# Patient Record
Sex: Male | Born: 1935 | Race: White | Hispanic: No | Smoking: Never smoker
Health system: Southern US, Community
[De-identification: ages and names within clinical notes are randomized; demographics above are authoritative.]

## PROBLEM LIST (undated history)

## (undated) DIAGNOSIS — F0390 Unspecified dementia without behavioral disturbance: Secondary | ICD-10-CM

## (undated) DIAGNOSIS — E119 Type 2 diabetes mellitus without complications: Secondary | ICD-10-CM

## (undated) DIAGNOSIS — C801 Malignant (primary) neoplasm, unspecified: Secondary | ICD-10-CM

## (undated) DIAGNOSIS — I240 Acute coronary thrombosis not resulting in myocardial infarction: Secondary | ICD-10-CM

## (undated) HISTORY — DX: Acute coronary thrombosis not resulting in myocardial infarction: I24.0

## (undated) HISTORY — PX: OTHER SURGICAL HISTORY: SHX169

## (undated) HISTORY — PX: CAROTID STENT: SHX1301

## (undated) HISTORY — DX: Unspecified dementia, unspecified severity, without behavioral disturbance, psychotic disturbance, mood disturbance, and anxiety: F03.90

## (undated) HISTORY — PX: KNEE SURGERY: SHX244

## (undated) HISTORY — PX: COLECTOMY: SHX59

## (undated) HISTORY — DX: Type 2 diabetes mellitus without complications: E11.9

## (undated) HISTORY — PX: HERNIA REPAIR: SHX51

---

## 2009-09-15 ENCOUNTER — Ambulatory Visit: Payer: Self-pay | Admitting: Ophthalmology

## 2009-10-20 ENCOUNTER — Ambulatory Visit: Payer: Self-pay | Admitting: Ophthalmology

## 2010-10-05 ENCOUNTER — Ambulatory Visit: Payer: Self-pay | Admitting: Orthopedic Surgery

## 2010-11-12 ENCOUNTER — Ambulatory Visit: Payer: Self-pay | Admitting: Family Medicine

## 2010-11-20 ENCOUNTER — Ambulatory Visit: Payer: Self-pay | Admitting: Internal Medicine

## 2011-10-23 ENCOUNTER — Ambulatory Visit: Payer: Self-pay | Admitting: Orthopedic Surgery

## 2011-11-01 ENCOUNTER — Inpatient Hospital Stay: Payer: Self-pay | Admitting: Orthopedic Surgery

## 2013-11-09 ENCOUNTER — Emergency Department: Payer: Self-pay | Admitting: Emergency Medicine

## 2013-11-14 ENCOUNTER — Encounter: Payer: Self-pay | Admitting: Podiatry

## 2013-11-14 ENCOUNTER — Ambulatory Visit: Payer: Medicare Other | Admitting: Podiatry

## 2013-11-14 VITALS — BP 115/70 | HR 60 | Resp 16 | Ht 71.0 in | Wt 191.0 lb

## 2013-11-14 DIAGNOSIS — L600 Ingrowing nail: Secondary | ICD-10-CM

## 2013-11-14 NOTE — Progress Notes (Signed)
Subjective:     Patient ID: Don Rhodes, male   DOB: [birth year], 79 y.o.   MRN: [id number]  HPI patient presents with a painful ingrown toenail left over right big toe. States it's been there for a long time he usually tries to pull it out himself   Review of Systems  All other systems reviewed and are negative.       Objective:   Physical Exam  Nursing note and vitals reviewed. Constitutional: He is oriented to person, place, and time.  Cardiovascular: Intact distal pulses.   Neurological: He is oriented to person, place, and time.  Skin: Skin is warm.   neurovascular status intact with muscle strength adequate and no other health history changes noted. Incurvated nail bed left hallux lateral border and mildly on the right with pain on the left hallux    Assessment:     Ingrown toenail left over right hallux lateral border    Plan:     H&P performed and conditions discussed. I recommended remove the of the and explained risk of this procedure. Infiltrated 60 mm Xylocaine Marcaine mixture and remove the lateral border exposing matrix and applying chemical phenol followed by alcohol 3 applications 30 seconds and sterile dressing. Instructed on soaks and to return for the right one if it bothers him

## 2013-11-14 NOTE — Patient Instructions (Signed)

## 2013-11-14 NOTE — Progress Notes (Signed)
   Subjective:    Patient ID: Don Rhodes, male    DOB: [birth year], 79 y.o.   MRN: [id number]  HPI Comments: N bleeding cut the toenail  L  Left great toenail lateral corner  D Thursday  night  O C better  A [facility] room for a tetanus shot      Review of Systems     Objective:   Physical Exam        Assessment & Plan:

## 2013-12-28 ENCOUNTER — Emergency Department: Payer: Self-pay | Admitting: Emergency Medicine

## 2013-12-28 LAB — BASIC METABOLIC PANEL
ANION GAP: 6 — AB (ref 7–16)
BUN: 11 mg/dL (ref 7–18)
CALCIUM: 9.6 mg/dL (ref 8.5–10.1)
CHLORIDE: 105 mmol/L (ref 98–107)
Co2: 26 mmol/L (ref 21–32)
Creatinine: 1.07 mg/dL (ref 0.60–1.30)
GLUCOSE: 149 mg/dL — AB (ref 65–99)
Osmolality: 276 (ref 275–301)
Potassium: 4.6 mmol/L (ref 3.5–5.1)
Sodium: 137 mmol/L (ref 136–145)

## 2013-12-28 LAB — CBC
HCT: 49 % (ref 40.0–52.0)
HGB: 16.4 g/dL (ref 13.0–18.0)
MCH: 30.8 pg (ref 26.0–34.0)
MCHC: 33.5 g/dL (ref 32.0–36.0)
MCV: 92 fL (ref 80–100)
Platelet: 172 10*3/uL (ref 150–440)
RBC: 5.33 10*6/uL (ref 4.40–5.90)
RDW: 13.6 % (ref 11.5–14.5)
WBC: 9.7 10*3/uL (ref 3.8–10.6)

## 2013-12-28 LAB — TROPONIN I
Troponin-I: <0.02 ng/mL
Troponin-I: <0.02 ng/mL

## 2013-12-28 LAB — PRO B NATRIURETIC PEPTIDE: B-Type Natriuretic Peptide: 102 pg/mL (ref 0–450)

## 2013-12-29 ENCOUNTER — Ambulatory Visit: Payer: Self-pay | Admitting: Gastroenterology

## 2014-01-05 ENCOUNTER — Ambulatory Visit: Payer: Self-pay | Admitting: Gastroenterology

## 2014-01-08 LAB — PATHOLOGY REPORT

## 2014-01-16 ENCOUNTER — Ambulatory Visit: Payer: Self-pay | Admitting: Gastroenterology

## 2014-11-18 ENCOUNTER — Ambulatory Visit: Payer: Self-pay | Admitting: Cardiology

## 2014-11-18 LAB — CK TOTAL AND CKMB (NOT AT ARMC)
CK, TOTAL: 88 U/L (ref 39–308)
CK-MB: 2.9 ng/mL (ref 0.5–3.6)

## 2014-11-19 LAB — BASIC METABOLIC PANEL
ANION GAP: 3 — AB (ref 7–16)
BUN: 15 mg/dL (ref 7–18)
CALCIUM: 8.2 mg/dL — AB (ref 8.5–10.1)
Chloride: 106 mmol/L (ref 98–107)
Co2: 31 mmol/L (ref 21–32)
Creatinine: 1.31 mg/dL — ABNORMAL HIGH (ref 0.60–1.30)
EGFR (Non-African Amer.): 56 — ABNORMAL LOW
GLUCOSE: 151 mg/dL — AB (ref 65–99)
Osmolality: 283 (ref 275–301)
POTASSIUM: 4.4 mmol/L (ref 3.5–5.1)
Sodium: 140 mmol/L (ref 136–145)

## 2014-11-26 ENCOUNTER — Ambulatory Visit: Payer: Self-pay | Admitting: Cardiology

## 2014-12-07 ENCOUNTER — Ambulatory Visit: Payer: Self-pay | Admitting: Physician Assistant

## 2015-01-24 ENCOUNTER — Ambulatory Visit: Payer: Self-pay | Admitting: Registered Nurse

## 2015-03-14 NOTE — Op Note (Signed)
PATIENT NAME:  Don Rhodes, Don Rhodes MR#:  [id number] DATE OF BIRTH:  [birth year]  DATE OF PROCEDURE:  11/06/2011  PREOPERATIVE DIAGNOSIS: Right knee painful unicompartmental knee replacement with possible infection.   POSTOPERATIVE DIAGNOSIS: Right knee painful unicompartmental knee replacement.   OPERATION: Right knee removal of antibiotic spacer and completion of right total knee arthroplasty.   SURGEON: Timoteo Gaul, MD   ASSISTANT: Christophe Louis, MD   ANESTHESIA: General with femoral nerve block and 0.25% Marcaine with epi local injection.   INDICATIONS FOR PROCEDURE: The patient is a 81 year old male who had his unicompartmental arthroplasty components removed on 11/01/2011. During removal of the components, the patient had a brownish discoloration to the synovium and the synovial fluid. Culture swabs were sent stat from the operating room and came back showing rare intracellular  coccobacilli. Therefore, an antibiotic spacer was placed after removal of the unicompartmental knee components. The patient was then closed and started on ertapenem for gram-negative coverage. He has had negative cultures for four and now five days. Infectious Disease specialist, Dr. Clayborn Bigness was consulted and has been following. He felt given the negative cultures it was appropriate to implant the patient's total knee components at this time. I reviewed again the risks and benefits of surgery with the patient. The risks of completing his total knee arthroplasty continue to include infection, nerve or blood vessel injury, bleeding requiring blood transfusion, persistent pain or instability, fracture, dislocation, knee stiffness, as well as the need for further surgery. Medical complications include DVT and pulmonary embolism, myocardial infarction, stroke, pneumonia, respiratory failure, and death. The patient signed the consent form. This was done on 11/05/2011 in preparation for surgery today.   PROCEDURE NOTE:  The patient was brought to the operating room where he underwent a femoral nerve block by the anesthesia service and then was given general endotracheal intubation. The patient was prepped and draped in a sterile fashion. A time-out was performed to verify the patient's name, date of birth, medical record number, correct site of surgery, and correct procedure to be performed. It was also used to verify the patient had received antibiotics. He had already received his dose of Ertapenem for this 24-hour period. He was given 2 grams of Kefzol in addition. It was also used to confirm that all appropriate instruments and radiographic studies were available in the room. Once all in attendance were in agreement, the case began.   The original incision was reopened. All suture material was removed. The antibiotic spacer was removed with a Kocher and then the knee joint was copiously irrigated with pulse lavage impregnated with GU antibiotic.   The attention was then turned to completing tibial preparation. The patient had bone loss on the medial side from his previous uniknee arthroplasty. A size 5 tibial trial tray was placed over the tibia with a cutting block attachment medially. This was pinned into place. The patient's bone defect was measured to be 10 mm involving greater than half of the medial tibial plateau. Therefore, decision was made to make a 10 mm cut which would require a 10 mm metallic augment. This 10 mm cut was then made after drop-down alignment guide was used to ensure perpendicular cut to the tibial mechanical access. Once the cut was made, a size 5 tibial trial was placed with a 10 mm augment and found to have excellent fill of the defect and excellent coverage of the proximal tibial surface.   Of note, the tibial Keel had been drilled just prior  to making the 10 mm cut for the augment which provided stability to the tibial tray during proximal tibial augment osteotomy.   The attention was then  turned to completing preparation of the distal femur. A box cutting guide was placed over the distal femur. It was pinned into place. The box for the posterior stabilized component was cut using small and wide blades. The central hole in the femoral canal was copiously irrigated and then plugged with bone graft. The size 4 femoral trial was then put into place and found to have excellent fit. A size 4 tibial tray was then inserted and the knee placed into extension. The patient was found to have more widening laterally. Medial soft tissue release was performed by creating a larger soft tissue sleeve over the proximal medial tibia. This allowed for placement of a size 4 12.5 mm tibial trial which had excellent medial and lateral stability both in full extension and 90 degrees of flexion. The knee was then placed into extension. The attention was turned to patella preparation.   The patella was templated to have the best fit at 41 mm patellar button. The thickness of the patella was found to be 20 mm. 17 mm was removed leaving patella 11 mm in thickness. The 41 mm patellar drill guide was placed over the osteotomized patella and the three pegs were drilled. The patellar trial was then put into place and the patella was returned to its place in the trochlea of the femur. The knee was taken through full range of motion and the patella was stable. All trial instruments were then removed. The knee was copiously irrigated. All bony surfaces were adequately dried.   Gentamicin impregnated methylmethacrylate was used for implantation of the actual components. A size 5 DePuy keeled tibial MDX tray with a 10 mm metallic medial augment was cemented into place along with a DePuy PFC Sigma posterior stabilized femoral component and a 41 mm patella and a 12.5 mm size 4 tibial polyethylene component for rotating platform. All components were cemented into place with a 12.5 mm trial spacer during the curing process of the  methylmethacrylate. The knee was then again taken through a full range of motion and found to be stable. The actual 12.5 mm size 4 tibial polyethylene tray was then placed. The wound again was copiously irrigated. The Autovac was placed with the two limbs of the Autovac exiting superolaterally. The medial arthrotomy was closed with interrupted #1 Ethibond. Again, the wound was copiously irrigated. The dermal layers of skin were closed with interrupted 2-0 Vicryl and the skin approximated with staples. A dry sterile dressing was applied. The tourniquet was let down at 127 minutes. The tourniquet had been inflated at the beginning of the case. It was inflated to 275 mmHg and the leg had been exsanguinated with an Esmarch prior to its inflation. Dry sterile dressing was applied along with a Polar Care and a knee immobilizer. The patient was brought to the PAC-U in stable condition after being extubated. I was scrubbed and present for the entire case and all sharp and instrument counts were correct at the conclusion of the case.   I spoke with the daughter by phone from the PAC-U to let her know the case had gone without complication and that her father was stable in the recovery room.   ____________________________ Timoteo Gaul, MD klk:drc D: 11/06/2011 20:23:01 ET T: 11/07/2011 08:31:35 ET JOB#: [id number]  cc: Timoteo Gaul, MD, <Dictator> Yaslyn Cumby L  Mack Guise MD ELECTRONICALLY SIGNED 11/27/2011 13:36

## 2015-03-14 NOTE — Discharge Summary (Signed)
PATIENT NAME:  Don Rhodes, EARNSHAW MR#:  [id number] DATE OF BIRTH:  [birth year]  DATE OF ADMISSION:  11/01/2011 DATE OF DISCHARGE:  11/10/2011  ADMITTING DIAGNOSES:  1. Right revision arthroplasty from unicompartmental to total knee arthroplasty. 2. Question of right knee infection.  HISTORY: Mr. Betts is a 81 year old male who has had persistent right knee pain for over a year. This has affected his ability to ambulate without severe pain. His Uniknee arthroplasty was performed in [place] in 2007. Given his pain and disability, he was scheduled for conversion of a Uniknee arthroplasty to a total knee arthroplasty here at [facility]. The patient was scheduled for elective surgery, which was scheduled on 11/01/2011.   HOSPITAL COURSE: The patient was admitted to the hospital for surgery on 11/01/2011. The patient during the operative procedure had brownish-appearing fluid discovered during the knee arthrotomy. This was sent to our microbiology department for stat Gram stain. The OR was called with the result of rare gram-negative coccobacilli. Given this finding, the surgical plan was changed. The polyethylene and metal components of the Uniknee arthroplasty were removed and the patient had already had his tibial and femoral osteotomies performed. Instead of implanting new total knee arthroplasty components, the decision was made to place an antibiotic spacer and to admit the patient to the hospital for IV antibiotic treatment. Dr. Lanae Boast, our infectious disease specialist, was consulted and agreed with this plan.   The patient was admitted to the hospital while final culture results were available. Dr. Clayborn Bigness followed the patient throughout his hospitalization as well. On postoperative day #1, the patient was comfortable and had no complaints. The patient was receiving ertapenem for the gram-negative coccobacilli. Postoperative day #2 the patient again was doing well without  pain. His laboratories remained stable including a CBC and BMP. The patient continued throughout his hospitalization without any acute medical or postoperative issues. His incision was clean, dry, and intact and was without erythema or drainage. The patient remained neurovascularly intact. The patient's cultures were negative throughout this hospitalization. Given these negative cultures, Dr. Clayborn Bigness felt it was appropriate to proceed with the remainder of his revision total knee surgery. On 12/17, Mr. Couser was brought back to the Operating Room. He underwent an uncomplicated implantation of a total knee arthroplasty prosthesis. Postoperatively, the patient was brought back to his hospital room. He had some issues with postoperative pain initially. Postoperative x-rays determine that the components were in good position. There is no evidence of fracture-dislocation or other bony abnormality. On postoperative day #1, the patient was out of bed to a chair. He had moderate pain in the right knee, but no other acute events. His postoperative hematocrit remained stable. He had physical and occupational therapy. Consults called and they continued to work with him throughout his hospitalization. On postoperative day #2, the physical therapist stated that the patient was struggling with physical therapy and was experiencing desaturations during physical therapy. He had required supplemental to maintain his saturation levels which is a clinical change. His hematocrit remained stable at 29.4. A CT PA gram was performed to rule out deep venous thrombosis and this was negative for pulmonary embolism, but just showed increased interstitial density in the lungs, possibly indicating a low-grade congestive heart failure or atelectasis. By postoperative day #3, the patient's pain had significantly improved. He was tolerating p.o. diet and CPM in his bed and by postoperative day #4, the patient was doing very well. He had no pain  in the right  knee. He had passed a bowel movement. He was participating well with physical therapy and his laboratories remained stable. Given the patient's clinical improvement, he was prepared for discharge to home. His Foley catheter had been removed by postoperative day #1 and he had undergone 24 hours of postoperative Kefzol after the second stage of his operation. The patient was doing well enough that he will be discharged home with services. With physical therapy, the patient was able to ambulate 300 feet with a rolling walker and maintain his partial weight-bearing status on the right side.   DISCHARGE INSTRUCTIONS:  1. The patient will be discharged home with PT and OT services and home health. 2. He will remain partial weight-bearing on the right lower extremity and elevate the right lower extremity whenever possible.  3. He will continue using TED hose on both legs.  4. He will be discharged on Lovenox 40 mg subcutaneous daily.  5. He will receive physical therapy.  6. He was written for a CPM device to continue 0 to 90 degrees of flexion as his pain allows. He will start at 30 degrees of flexion and advance 5 to 10 degrees every day as his pain allows.  7. The patient will be seen in the office in 7 to 10 days postoperative and will have his staples removed at the time of his office visit.  8. He will have daily dressing changes until his incision is dry.  9. He is to avoid getting the knee incision wet. He will contact the office with any changes in bowel or bladder function, weakness in the lower extremity or any numbness or tingling in the lower extremity or redness and swelling.   DISCHARGE MEDICATIONS: 1. Aspirin 81 mg.  2. Clonazepam 1 mg b.i.d. p.r.n.  3. Glipizide 2.5 mg extended-release 1 tablet daily. 4. Metoprolol 25 mg once daily. 5. Namenda 10 mg 1 tablet b.i.d.  6. Colace 100 mg daily. 7. Pravastatin 80 mg daily. 8. Sertraline 25 mg daily.  9. Tramadol 50 mg every six  hours p.r.n.  10. MiraLAX oral powder once daily p.r.n.  11. Oxycodone 5 milligrams 1 to 2 tabs every 4 to 6 hours p.r.n. for pain. 12. Lovenox 40 mg subcutaneous daily.   DISCHARGE DIAGNOSES: 1. Right knee stage revision arthroplasty from unicompartmental to total knee arthroplasty.  2. No evidence of septic right knee by culture.   ____________________________ Timoteo Gaul, MD klk:ap D: 12/10/2011 13:36:21 ET T: 12/10/2011 14:33:47 ET JOB#: [id number]  cc: Timoteo Gaul, MD, <Dictator> Timoteo Gaul MD ELECTRONICALLY SIGNED 12/12/2011 9:58

## 2015-06-05 ENCOUNTER — Emergency Department
Admission: EM | Admit: 2015-06-05 | Discharge: 2015-06-05 | Disposition: A | Discharge status: Home / Self Care | Payer: Medicare Other | Attending: Emergency Medicine | Admitting: Emergency Medicine

## 2015-06-05 ENCOUNTER — Encounter: Payer: Self-pay | Admitting: *Deleted

## 2015-06-05 DIAGNOSIS — Z79899 Other long term (current) drug therapy: Secondary | ICD-10-CM | POA: Diagnosis not present

## 2015-06-05 DIAGNOSIS — Z7982 Long term (current) use of aspirin: Secondary | ICD-10-CM | POA: Diagnosis not present

## 2015-06-05 DIAGNOSIS — K088 Other specified disorders of teeth and supporting structures: Secondary | ICD-10-CM | POA: Diagnosis present

## 2015-06-05 DIAGNOSIS — Z008 Encounter for other general examination: Secondary | ICD-10-CM | POA: Insufficient documentation

## 2015-06-05 DIAGNOSIS — IMO0001 Reserved for inherently not codable concepts without codable children: Secondary | ICD-10-CM

## 2015-06-05 DIAGNOSIS — E119 Type 2 diabetes mellitus without complications: Secondary | ICD-10-CM | POA: Diagnosis not present

## 2015-06-05 DIAGNOSIS — Z139 Encounter for screening, unspecified: Secondary | ICD-10-CM

## 2015-06-05 HISTORY — DX: Malignant (primary) neoplasm, unspecified: C80.1

## 2015-06-05 MED ORDER — LIDOCAINE VISCOUS 2 % MT SOLN
15.0000 mL | Freq: Once | OROMUCOSAL | Status: AC
  Administered 2015-06-05: 15 mL via OROMUCOSAL

## 2015-06-05 MED ORDER — LIDOCAINE VISCOUS 2 % MT SOLN
OROMUCOSAL | Status: AC
  Administered 2015-06-05: 15 mL via OROMUCOSAL
  Filled 2015-06-05: qty 15

## 2015-06-05 NOTE — ED Provider Notes (Signed)
[facility] Emergency Department Provider Note  ____________________________________________  Time seen: 12:55 AM  I have reviewed the triage vital signs and the nursing notes.   HISTORY  Chief Complaint Ingestion      HPI Don Rhodes is a 81 y.o. male presents with history of accidentally brushing his teeth with a generic form of BenGay approximately one hour before presentation. Patient admits to going irritation following event. He stated that he stops only brush his teeth with toothpaste and rinsed his mouth repeatedly.     Past Medical History  Diagnosis Date  . Diabetes mellitus without complication   . Blockage of coronary artery of heart   . Dementia   . Cancer     prostate    There are no active problems to display for this patient.   Past Surgical History  Procedure Laterality Date  . Knee surgery    . Colectomy    . Back operation    . Hernia repair    . Carotid stent      Current Outpatient Rx  Name  Route  Sig  Dispense  Refill  . aspirin 81 MG tablet   Oral   Take 81 mg by mouth daily.         Marland Kitchen glipizide-metformin (METAGLIP) 2.5-250 MG per tablet   Oral   Take 1 tablet by mouth 2 (two) times daily before a meal.         . memantine (NAMENDA) 10 MG tablet   Oral   Take 10 mg by mouth 2 (two) times daily.         . metoprolol tartrate (LOPRESSOR) 25 MG tablet   Oral   Take 25 mg by mouth 2 (two) times daily.         . mirabegron ER (MYRBETRIQ) 25 MG TB24 tablet   Oral   Take 25 mg by mouth daily.         . pravastatin (PRAVACHOL) 80 MG tablet   Oral   Take 80 mg by mouth daily.           Allergies Benadryl  No family history on file.  Social History History  Substance Use Topics  . Smoking status: Never Smoker   . Smokeless tobacco: Never Used  . Alcohol Use: No    Review of Systems  Constitutional: Negative for fever. Eyes: Negative for visual changes. ENT: Negative for  sore throat. Positive for gum irritation Cardiovascular: Negative for chest pain. Respiratory: Negative for shortness of breath. Gastrointestinal: Negative for abdominal pain, vomiting and diarrhea. Genitourinary: Negative for dysuria. Musculoskeletal: Negative for back pain. Skin: Negative for rash. Neurological: Negative for headaches, focal weakness or numbness.   10-point ROS otherwise negative.  ____________________________________________   PHYSICAL EXAM:  VITAL SIGNS: ED Triage Vitals  Enc Vitals Group     BP 06/05/15 0017 146/65 mmHg     Pulse Rate 06/05/15 0017 55     Resp 06/05/15 0017 18     Temp 06/05/15 0017 98.1 F (36.7 C)     Temp Source 06/05/15 0017 Oral     SpO2 06/05/15 0017 96 %     Weight 06/05/15 0017 175 lb (79.379 kg)     Height 06/05/15 0017 5\' 11"  (1.803 m)     Head Cir --      Peak Flow --      Pain Score --      Pain Loc --      Pain Edu? --  Excl. in [place]? --     Constitutional: Alert and oriented. Well appearing and in no distress. Eyes: Conjunctivae are normal. PERRL. Normal extraocular movements. ENT   Head: Normocephalic and atraumatic.   Nose: No congestion/rhinnorhea.   Mouth/Throat: Mucous membranes are moist. Mild erythema noted to the gumline   Neck: No stridor. Hematological/Lymphatic/Immunilogical: No cervical lymphadenopathy. Cardiovascular: Normal rate, regular rhythm. Normal and symmetric distal pulses are present in all extremities. No murmurs, rubs, or gallops. Respiratory: Normal respiratory effort without tachypnea nor retractions. Breath sounds are clear and equal bilaterally. No wheezes/rales/rhonchi. Gastrointestinal: Soft and nontender. No distention. There is no CVA tenderness. Genitourinary: deferred Musculoskeletal: Nontender with normal range of motion in all extremities. No joint effusions.  No lower extremity tenderness nor edema. Neurologic:  Normal speech and language. No gross focal neurologic  deficits are appreciated. Speech is normal.  Skin:  Skin is warm, dry and intact. No rash noted. Psychiatric: Mood and affect are normal. Speech and behavior are normal. Patient exhibits appropriate insight and judgment.    INITIAL IMPRESSION / ASSESSMENT AND PLAN / ED COURSE  Pertinent labs & imaging results that were available during my care of the patient were reviewed by me and considered in my medical decision making (see chart for details).  Patient was given viscous lidocaine swish and spit with complete resolution of discomfort.  ____________________________________________   FINAL CLINICAL IMPRESSION(S) / ED DIAGNOSES  Final diagnoses:  Irritation of gums and throat  Encounter for medical screening examination      Gregor Hams, MD 06/05/15 0111

## 2015-06-05 NOTE — Discharge Instructions (Signed)
Dental Care and Dentist Visits  Dental care supports good overall health. Regular dental visits can also help you avoid dental pain, bleeding, infection, and other more serious health problems in the future. It is important to keep the mouth healthy because diseases in the teeth, gums, and other oral tissues can spread to other areas of the body. Some problems, such as diabetes, heart disease, and pre-term labor have been associated with poor oral health.   See your dentist every 6 months. If you experience emergency problems such as a toothache or broken tooth, go to the dentist right away. If you see your dentist regularly, you may catch problems early. It is easier to be treated for problems in the early stages.   WHAT TO EXPECT AT A DENTIST VISIT   Your dentist will look for many common oral health problems and recommend proper treatment. At your regular dental visit, you can expect:  · Gentle cleaning of the teeth and gums. This includes scraping and polishing. This helps to remove the sticky substance around the teeth and gums (plaque). Plaque forms in the mouth shortly after eating. Over time, plaque hardens on the teeth as tartar. If tartar is not removed regularly, it can cause problems. Cleaning also helps remove stains.  · Periodic X-rays. These pictures of the teeth and supporting bone will help your dentist assess the health of your teeth.  · Periodic fluoride treatments. Fluoride is a natural mineral shown to help strengthen teeth. Fluoride treatment involves applying a fluoride gel or varnish to the teeth. It is most commonly done in children.  · Examination of the mouth, tongue, jaws, teeth, and gums to look for any oral health problems, such as:  ¨ Cavities (dental caries). This is decay on the tooth caused by plaque, sugar, and acid in the mouth. It is best to catch a cavity when it is small.  ¨ Inflammation of the gums caused by plaque buildup (gingivitis).  ¨ Problems with the mouth or malformed  or misaligned teeth.  ¨ Oral cancer or other diseases of the soft tissues or jaws.   KEEP YOUR TEETH AND GUMS HEALTHY  For healthy teeth and gums, follow these general guidelines as well as your dentist's specific advice:  · Have your teeth professionally cleaned at the dentist every 6 months.  · Brush twice daily with a fluoride toothpaste.  · Floss your teeth daily.   · Ask your dentist if you need fluoride supplements, treatments, or fluoride toothpaste.  · Eat a healthy diet. Reduce foods and drinks with added sugar.  · Avoid smoking.  TREATMENT FOR ORAL HEALTH PROBLEMS  If you have oral health problems, treatment varies depending on the conditions present in your teeth and gums.  · Your caregiver will most likely recommend good oral hygiene at each visit.  · For cavities, gingivitis, or other oral health disease, your caregiver will perform a procedure to treat the problem. This is typically done at a separate appointment. Sometimes your caregiver will refer you to another dental specialist for specific tooth problems or for surgery.  SEEK IMMEDIATE DENTAL CARE IF:  · You have pain, bleeding, or soreness in the gum, tooth, jaw, or mouth area.  · A permanent tooth becomes loose or separated from the gum socket.  · You experience a blow or injury to the mouth or jaw area.  Document Released: 07/19/2011 Document Revised: 01/29/2012 Document Reviewed: 07/19/2011  [employer]® Patient Information ©2015 [employer]. This information is not intended to replace advice   given to you by your health care provider. Make sure you discuss any questions you have with your health care provider.

## 2015-06-05 NOTE — ED Notes (Signed)
Pt states that he put a generic bengay on his toothbrush instead of toothpaste. He says he immediately spit it out, brushed his teeth and rinsed out. He says his mouth "just feels funny".

## 2015-06-05 NOTE — ED Notes (Signed)
Pt unsure of medications taken at home, pt states "i take  A blood thinner and something for my sugar if it's  Up, but i don't know the names."

## 2016-01-02 IMAGING — CR DG CHEST 1V PORT
1 series · 1 of 1 positions shown · non-contrast
Comparison: 11/10/2011

CLINICAL DATA: Shortness of breath, choking and chest pain.

EXAM:
PORTABLE CHEST - 1 VIEW

[ap]
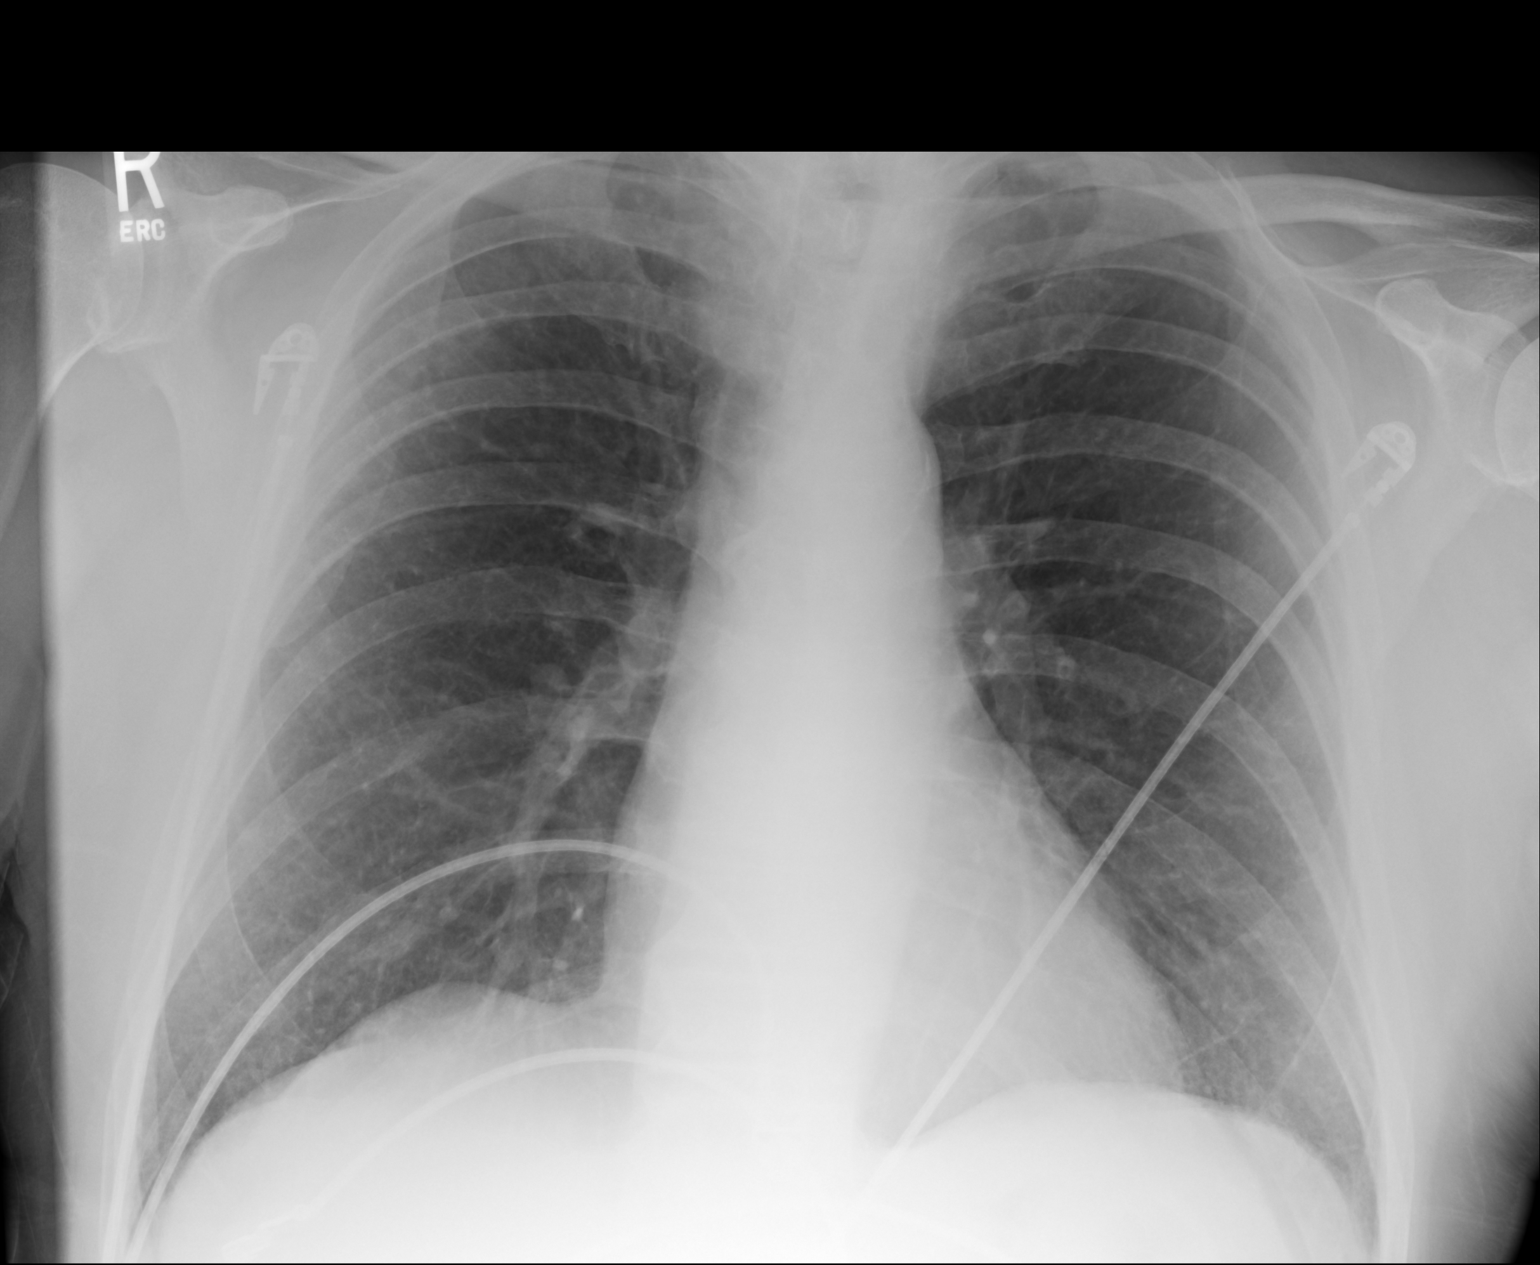

[1 of 1 positions shown; findings below may reference images not displayed]

FINDINGS: The cardiomediastinal silhouette is within normal limits. The lungs
are well inflated and clear. There is no evidence of pleural
effusion or pneumothorax. No acute osseous abnormality is
identified.
IMPRESSION: No active disease.

## 2016-01-03 ENCOUNTER — Encounter: Payer: Self-pay | Admitting: Emergency Medicine

## 2016-01-03 ENCOUNTER — Emergency Department
Admission: EM | Admit: 2016-01-03 | Discharge: 2016-01-03 | Disposition: A | Discharge status: Home / Self Care | Payer: Medicare Other | Attending: Emergency Medicine | Admitting: Emergency Medicine

## 2016-01-03 ENCOUNTER — Emergency Department: Payer: Medicare Other

## 2016-01-03 DIAGNOSIS — R1084 Generalized abdominal pain: Secondary | ICD-10-CM | POA: Diagnosis not present

## 2016-01-03 DIAGNOSIS — I10 Essential (primary) hypertension: Secondary | ICD-10-CM | POA: Diagnosis not present

## 2016-01-03 DIAGNOSIS — E119 Type 2 diabetes mellitus without complications: Secondary | ICD-10-CM | POA: Insufficient documentation

## 2016-01-03 DIAGNOSIS — Z79899 Other long term (current) drug therapy: Secondary | ICD-10-CM | POA: Diagnosis not present

## 2016-01-03 DIAGNOSIS — Z7982 Long term (current) use of aspirin: Secondary | ICD-10-CM | POA: Insufficient documentation

## 2016-01-03 LAB — COMPREHENSIVE METABOLIC PANEL
ALBUMIN: 4.4 g/dL (ref 3.5–5.0)
ALT: 44 U/L (ref 17–63)
ANION GAP: 9 (ref 5–15)
AST: 37 U/L (ref 15–41)
Alkaline Phosphatase: 50 U/L (ref 38–126)
BILIRUBIN TOTAL: 0.5 mg/dL (ref 0.3–1.2)
BUN: 27 mg/dL — ABNORMAL HIGH (ref 6–20)
CALCIUM: 9 mg/dL (ref 8.9–10.3)
CO2: 22 mmol/L (ref 22–32)
Chloride: 102 mmol/L (ref 101–111)
Creatinine, Ser: 1.43 mg/dL — ABNORMAL HIGH (ref 0.61–1.24)
GFR calc non Af Amer: 44 mL/min — ABNORMAL LOW (ref >60)
GFR, EST AFRICAN AMERICAN: 51 mL/min — AB (ref >60)
GLUCOSE: 313 mg/dL — AB (ref 65–99)
POTASSIUM: 4.5 mmol/L (ref 3.5–5.1)
Sodium: 133 mmol/L — ABNORMAL LOW (ref 135–145)
TOTAL PROTEIN: 7.1 g/dL (ref 6.5–8.1)

## 2016-01-03 LAB — URINALYSIS COMPLETE WITH MICROSCOPIC (ARMC ONLY)
BACTERIA UA: NONE SEEN
Bilirubin Urine: NEGATIVE
HGB URINE DIPSTICK: NEGATIVE
Ketones, ur: NEGATIVE mg/dL
Leukocytes, UA: NEGATIVE
NITRITE: NEGATIVE
PH: 6 (ref 5.0–8.0)
PROTEIN: NEGATIVE mg/dL
RBC / HPF: NONE SEEN RBC/hpf (ref 0–5)
Specific Gravity, Urine: 1.014 (ref 1.005–1.030)
Squamous Epithelial / LPF: NONE SEEN

## 2016-01-03 LAB — CBC
HEMATOCRIT: 47.8 % (ref 40.0–52.0)
HEMOGLOBIN: 15.8 g/dL (ref 13.0–18.0)
MCH: 30.4 pg (ref 26.0–34.0)
MCHC: 33.1 g/dL (ref 32.0–36.0)
MCV: 91.9 fL (ref 80.0–100.0)
Platelets: 192 10*3/uL (ref 150–440)
RBC: 5.2 MIL/uL (ref 4.40–5.90)
RDW: 14.7 % — ABNORMAL HIGH (ref 11.5–14.5)
WBC: 16.4 10*3/uL — AB (ref 3.8–10.6)

## 2016-01-03 LAB — LIPASE, BLOOD: Lipase: 33 U/L (ref 11–51)

## 2016-01-03 MED ORDER — FAMOTIDINE IN NACL 20-0.9 MG/50ML-% IV SOLN
20.0000 mg | Freq: Once | INTRAVENOUS | Status: AC
  Administered 2016-01-03: 20 mg via INTRAVENOUS
  Filled 2016-01-03: qty 50

## 2016-01-03 MED ORDER — RANITIDINE HCL 150 MG PO CAPS: 150.0000 mg | ORAL_CAPSULE | Freq: Two times a day (BID) | ORAL | Status: DC

## 2016-01-03 MED ORDER — ALUM & MAG HYDROXIDE-SIMETH 200-200-20 MG/5ML PO SUSP
30.0000 mL | Freq: Once | ORAL | Status: AC
  Administered 2016-01-03: 30 mL via ORAL
  Filled 2016-01-03: qty 30

## 2016-01-03 MED ORDER — SUCRALFATE 1 G PO TABS: 1.0000 g | ORAL_TABLET | Freq: Four times a day (QID) | ORAL | Status: DC

## 2016-01-03 MED ORDER — IOHEXOL 350 MG/ML SOLN
100.0000 mL | Freq: Once | INTRAVENOUS | Status: AC | PRN
  Administered 2016-01-03: 100 mL via INTRAVENOUS

## 2016-01-03 NOTE — ED Notes (Signed)
Pt currently being treated for Shingles by PCP, Airborne Isolation initiated.

## 2016-01-03 NOTE — ED Notes (Signed)
Patient transported to CT 

## 2016-01-03 NOTE — Discharge Instructions (Signed)
Your blood tests and CT scan of the abdomen did not reveal any acute issues today to explain the pain in you are having. This may be related to stomach upset. Use medications as prescribed to suppress your stomach acid to help with the symptoms. The CT scan revealed a few incidental findings.  Please review the summary below and share with your primary care doctor for further follow-up.  CT Scan IMPRESSION: No acute intra-abdominal or pelvic pathology. Constipation. No evidence of bowel obstruction or inflammation.  Colonic diverticulosis without active inflammatory changes.  Small enhancing foci throughout the liver likely related to underlying portal venous shunting or small flash filling hemangiomas. MRI is recommended for further evaluation.  A 3.1 cm infrarenal abdominal aortic aneurysm as well as bilateral common iliac or inguinal regions measuring up to 2.1 cm on the left. Recommend followup by ultrasound in 3 years. This recommendation follows ACR consensus guidelines: White Paper of the ACR Incidental Findings Committee II on Vascular Findings. J Am Coll Radiol 2013; 10:789-794  Abdominal Pain, Adult Many things can cause abdominal pain. Usually, abdominal pain is not caused by a disease and will improve without treatment. It can often be observed and treated at home. Your health care provider will do a physical exam and possibly order blood tests and X-rays to help determine the seriousness of your pain. However, in many cases, more time must pass before a clear cause of the pain can be found. Before that point, your health care provider may not know if you need more testing or further treatment. HOME CARE INSTRUCTIONS Monitor your abdominal pain for any changes. The following actions may help to alleviate any discomfort you are experiencing:  Only take over-the-counter or prescription medicines as directed by your health care provider.  Do not take laxatives unless directed to do  so by your health care provider.  Try a clear liquid diet (broth, tea, or water) as directed by your health care provider. Slowly move to a bland diet as tolerated. SEEK MEDICAL CARE IF:  You have unexplained abdominal pain.  You have abdominal pain associated with nausea or diarrhea.  You have pain when you urinate or have a bowel movement.  You experience abdominal pain that wakes you in the night.  You have abdominal pain that is worsened or improved by eating food.  You have abdominal pain that is worsened with eating fatty foods.  You have a fever. SEEK IMMEDIATE MEDICAL CARE IF:  Your pain does not go away within 2 hours.  You keep throwing up (vomiting).  Your pain is felt only in portions of the abdomen, such as the right side or the left lower portion of the abdomen.  You pass bloody or black tarry stools. MAKE SURE YOU:  Understand these instructions.  Will watch your condition.  Will get help right away if you are not doing well or get worse.   This information is not intended to replace advice given to you by your health care provider. Make sure you discuss any questions you have with your health care provider.   Document Released: 08/16/2005 Document Revised: 07/28/2015 Document Reviewed: 07/16/2013 Elsevier Interactive Patient Education [employer].

## 2016-01-03 NOTE — ED Notes (Signed)
Pt states he has stopped taking his DM medication, per self. Pt states he monitors his blood sugar on a daily basis.

## 2016-01-03 NOTE — ED Notes (Signed)
States has had abdominal pain since January. Pt states history of dementia and trouble giving history.

## 2016-01-03 NOTE — ED Provider Notes (Addendum)
[facility] Emergency Department Provider Note  ____________________________________________  Time seen: 6:35 PM  I have reviewed the triage vital signs and the nursing notes.   HISTORY  Chief Complaint Abdominal Pain    HPI Don Rhodes is a 82 y.o. male who complains of generalized abdominal pain for the past 3 or 4 days. Seems to come in waves has a history of GERD diabetes and hypertension. He also has recently been treated for shingles and has been on prednisone for the past week.  The abdominal pain radiates to the bilateral lower quadrants from his epigastrium. It does not have any chest pain shortness of breath or back pain. No nausea vomiting or diarrhea. He is unable to describe the character of the pain. Intermittent, lasting for several minutes at a time, severe at times and unbearable.   Past Medical History  Diagnosis Date  . Diabetes mellitus without complication ([place])   . Blockage of coronary artery of heart ([place])   . Dementia   . Cancer [facility])     prostate     There are no active problems to display for this patient.    Past Surgical History  Procedure Laterality Date  . Knee surgery    . Colectomy    . Back operation    . Hernia repair    . Carotid stent       Current Outpatient Rx  Name  Route  Sig  Dispense  Refill  . aspirin 81 MG tablet   Oral   Take 81 mg by mouth daily.         Marland Kitchen glipizide-metformin (METAGLIP) 2.5-250 MG per tablet   Oral   Take 1 tablet by mouth 2 (two) times daily before a meal.         . memantine (NAMENDA) 10 MG tablet   Oral   Take 10 mg by mouth 2 (two) times daily.         . metoprolol tartrate (LOPRESSOR) 25 MG tablet   Oral   Take 25 mg by mouth 2 (two) times daily.         . mirabegron ER (MYRBETRIQ) 25 MG TB24 tablet   Oral   Take 25 mg by mouth daily.         . pravastatin (PRAVACHOL) 80 MG tablet   Oral   Take 80 mg by mouth daily.         .  ranitidine (ZANTAC) 150 MG capsule   Oral   Take 1 capsule (150 mg total) by mouth 2 (two) times daily.   28 capsule   0   . sucralfate (CARAFATE) 1 g tablet   Oral   Take 1 tablet (1 g total) by mouth 4 (four) times daily.   120 tablet   1      Allergies Benadryl   No family history on file.  Social History Social History  Substance Use Topics  . Smoking status: Never Smoker   . Smokeless tobacco: Never Used  . Alcohol Use: No    Review of Systems  Constitutional:   No fever or chills. No weight changes Eyes:   No blurry vision or double vision.  ENT:   No sore throat. Cardiovascular:   No chest pain. Respiratory:   No dyspnea or cough. Gastrointestinal:   Positive as above for abdominal pain without vomiting and diarrhea.  No BRBPR or melena. Genitourinary:   Negative for dysuria, urinary retention, bloody urine, or difficulty urinating.  Musculoskeletal:   Negative for back pain. No joint swelling or pain. Skin:   Negative for rash. Neurological:   Negative for headaches, focal weakness or numbness. Psychiatric:  No anxiety or depression.   Endocrine:  No hot/cold intolerance, changes in energy, or sleep difficulty.  10-point ROS otherwise negative.  ____________________________________________   PHYSICAL EXAM:  VITAL SIGNS: ED Triage Vitals  Enc Vitals Group     BP 01/03/16 1610 161/85 mmHg     Pulse Rate 01/03/16 1610 94     Resp 01/03/16 1610 18     Temp 01/03/16 1610 98.2 F (36.8 C)     Temp Source 01/03/16 1610 Oral     SpO2 01/03/16 1610 94 %     Weight 01/03/16 1610 180 lb (81.647 kg)     Height 01/03/16 1610 5\' 11"  (1.803 m)     Head Cir --      Peak Flow --      Pain Score 01/03/16 1612 8     Pain Loc --      Pain Edu? --      Excl. in [place]? --     Vital signs reviewed, nursing assessments reviewed.   Constitutional:   Alert and oriented. Well appearing and in no distress. Eyes:   No scleral icterus. No conjunctival pallor. PERRL.  EOMI ENT   Head:   Normocephalic and atraumatic.   Nose:   No congestion/rhinnorhea. No septal hematoma   Mouth/Throat:   MMM, no pharyngeal erythema. No peritonsillar mass. No uvula shift.   Neck:   No stridor. No SubQ emphysema. No meningismus. Hematological/Lymphatic/Immunilogical:   No cervical lymphadenopathy. Cardiovascular:   RRR. Normal and symmetric distal pulses are present in all extremities. No murmurs, rubs, or gallops. Respiratory:   Normal respiratory effort without tachypnea nor retractions. Breath sounds are clear and equal bilaterally. No wheezes/rales/rhonchi. Gastrointestinal:   Soft with diffuse moderate tenderness. No distention. There is no CVA tenderness.  No rebound, rigidity, or guarding. Rectal exam reveals slightly enlarged prostate. Brown stool, Hemoccult negative, QC controls okay Genitourinary:   deferred Musculoskeletal:   Nontender with normal range of motion in all extremities. No joint effusions.  No lower extremity tenderness.  No edema. Neurologic:   Normal speech and language.  CN 2-10 normal. Motor grossly intact. No pronator drift.  Normal gait. No gross focal neurologic deficits are appreciated.  Skin:    Skin is warm, dry and intact. No rash noted.  No petechiae, purpura, or bullae. Psychiatric:   Mood and affect are normal. Speech and behavior are normal. Patient exhibits appropriate insight and judgment.  ____________________________________________    LABS (pertinent positives/negatives) (all labs ordered are listed, but only abnormal results are displayed) Labs Reviewed  COMPREHENSIVE METABOLIC PANEL - Abnormal; Notable for the following:    Sodium 133 (*)    Glucose, Bld 313 (*)    BUN 27 (*)    Creatinine, Ser 1.43 (*)    GFR calc non Af Amer 44 (*)    GFR calc Af Amer 51 (*)    All other components within normal limits  CBC - Abnormal; Notable for the following:    WBC 16.4 (*)    RDW 14.7 (*)    All other components  within normal limits  URINALYSIS COMPLETEWITH MICROSCOPIC ([facility] ONLY) - Abnormal; Notable for the following:    Color, Urine YELLOW (*)    APPearance CLEAR (*)    Glucose, UA >500 (*)    All other components  within normal limits  LIPASE, BLOOD   ____________________________________________   EKG    ____________________________________________    RADIOLOGY  CT angiogram of the abdomen pelvis essentially unremarkable. Some incidental findings which were shared with the patient for follow-up with primary care.  ____________________________________________   PROCEDURES   ____________________________________________   INITIAL IMPRESSION / ASSESSMENT AND PLAN / ED COURSE  Pertinent labs & imaging results that were available during my care of the patient were reviewed by me and considered in my medical decision making (see chart for details).  Patient presented generalized abdominal pain. He was seen at [facility] 3 days ago on the first day of symptoms where he had a unremarkable CT scan. I reviewed this result in the electronic medical record. However, he has persistent worsening pain, he is has some dementia and limited history ability and is elderly and has a leukocytosis of 16,000. We will repeat the scan with an angiography.  ----------------------------------------- 9:38 PM on 01/03/2016 -----------------------------------------  Workup negative. No evidence of any flow-limiting obstruction or mesenteric ischemia. I think likely he is having some worsening gastritis due to the prednisone use. We'll continue on GI regimen and acid suppression and have him follow up with GI. He has an appointment with the GI nurse practitioner in 3 days.     ____________________________________________   FINAL CLINICAL IMPRESSION(S) / ED DIAGNOSES  Final diagnoses:  Generalized abdominal pain      Carrie Mew, MD 01/03/16 2139  Carrie Mew, MD 01/03/16  2150

## 2016-01-06 ENCOUNTER — Other Ambulatory Visit: Payer: Self-pay | Admitting: Gastroenterology

## 2016-01-06 DIAGNOSIS — K769 Liver disease, unspecified: Secondary | ICD-10-CM

## 2016-01-26 ENCOUNTER — Other Ambulatory Visit: Payer: Medicare Other

## 2016-01-26 ENCOUNTER — Other Ambulatory Visit: Payer: Self-pay | Admitting: Gastroenterology

## 2016-01-26 ENCOUNTER — Ambulatory Visit
Admission: RE | Admit: 2016-01-26 | Discharge: 2016-01-26 | Disposition: A | Discharge status: Home / Self Care | Payer: Medicare Other | Source: Ambulatory Visit | Attending: Gastroenterology | Admitting: Gastroenterology

## 2016-01-26 DIAGNOSIS — I7090 Unspecified atherosclerosis: Secondary | ICD-10-CM | POA: Insufficient documentation

## 2016-01-26 DIAGNOSIS — Z1810 Retained metal fragments, unspecified: Secondary | ICD-10-CM | POA: Diagnosis present

## 2016-01-26 DIAGNOSIS — Z135 Encounter for screening for eye and ear disorders: Secondary | ICD-10-CM | POA: Insufficient documentation

## 2016-01-26 DIAGNOSIS — S0095XA Superficial foreign body of unspecified part of head, initial encounter: Secondary | ICD-10-CM

## 2016-01-26 DIAGNOSIS — K7689 Other specified diseases of liver: Secondary | ICD-10-CM | POA: Insufficient documentation

## 2016-01-26 DIAGNOSIS — I714 Abdominal aortic aneurysm, without rupture: Secondary | ICD-10-CM | POA: Insufficient documentation

## 2016-01-26 DIAGNOSIS — K769 Liver disease, unspecified: Secondary | ICD-10-CM

## 2016-01-26 MED ORDER — GADOBENATE DIMEGLUMINE 529 MG/ML IV SOLN
20.0000 mL | Freq: Once | INTRAVENOUS | Status: AC | PRN
  Administered 2016-01-26: 17 mL via INTRAVENOUS

## 2017-03-09 ENCOUNTER — Emergency Department: Payer: Medicare Other

## 2017-03-09 ENCOUNTER — Encounter: Payer: Self-pay | Admitting: Emergency Medicine

## 2017-03-09 ENCOUNTER — Inpatient Hospital Stay
Admission: EM | Admit: 2017-03-09 | Discharge: 2017-03-12 | DRG: 281 | Disposition: A | Discharge status: Home / Self Care | Payer: Medicare Other | Attending: Specialist | Admitting: Specialist

## 2017-03-09 DIAGNOSIS — Z8249 Family history of ischemic heart disease and other diseases of the circulatory system: Secondary | ICD-10-CM

## 2017-03-09 DIAGNOSIS — F0390 Unspecified dementia without behavioral disturbance: Secondary | ICD-10-CM | POA: Diagnosis present

## 2017-03-09 DIAGNOSIS — E1122 Type 2 diabetes mellitus with diabetic chronic kidney disease: Secondary | ICD-10-CM | POA: Diagnosis present

## 2017-03-09 DIAGNOSIS — F419 Anxiety disorder, unspecified: Secondary | ICD-10-CM | POA: Diagnosis present

## 2017-03-09 DIAGNOSIS — I4510 Unspecified right bundle-branch block: Secondary | ICD-10-CM | POA: Diagnosis present

## 2017-03-09 DIAGNOSIS — I214 Non-ST elevation (NSTEMI) myocardial infarction: Secondary | ICD-10-CM | POA: Diagnosis not present

## 2017-03-09 DIAGNOSIS — Z8546 Personal history of malignant neoplasm of prostate: Secondary | ICD-10-CM

## 2017-03-09 DIAGNOSIS — N183 Chronic kidney disease, stage 3 (moderate): Secondary | ICD-10-CM | POA: Diagnosis present

## 2017-03-09 DIAGNOSIS — K219 Gastro-esophageal reflux disease without esophagitis: Secondary | ICD-10-CM | POA: Diagnosis present

## 2017-03-09 DIAGNOSIS — Z803 Family history of malignant neoplasm of breast: Secondary | ICD-10-CM

## 2017-03-09 DIAGNOSIS — Z833 Family history of diabetes mellitus: Secondary | ICD-10-CM

## 2017-03-09 DIAGNOSIS — I2510 Atherosclerotic heart disease of native coronary artery without angina pectoris: Secondary | ICD-10-CM | POA: Diagnosis present

## 2017-03-09 DIAGNOSIS — Z955 Presence of coronary angioplasty implant and graft: Secondary | ICD-10-CM

## 2017-03-09 DIAGNOSIS — E785 Hyperlipidemia, unspecified: Secondary | ICD-10-CM | POA: Diagnosis present

## 2017-03-09 DIAGNOSIS — F05 Delirium due to known physiological condition: Secondary | ICD-10-CM | POA: Diagnosis not present

## 2017-03-09 DIAGNOSIS — Z7984 Long term (current) use of oral hypoglycemic drugs: Secondary | ICD-10-CM

## 2017-03-09 DIAGNOSIS — Z7902 Long term (current) use of antithrombotics/antiplatelets: Secondary | ICD-10-CM

## 2017-03-09 DIAGNOSIS — I252 Old myocardial infarction: Secondary | ICD-10-CM

## 2017-03-09 DIAGNOSIS — R079 Chest pain, unspecified: Secondary | ICD-10-CM | POA: Diagnosis not present

## 2017-03-09 DIAGNOSIS — Y92230 Patient room in hospital as the place of occurrence of the external cause: Secondary | ICD-10-CM | POA: Diagnosis not present

## 2017-03-09 DIAGNOSIS — T426X5A Adverse effect of other antiepileptic and sedative-hypnotic drugs, initial encounter: Secondary | ICD-10-CM | POA: Diagnosis not present

## 2017-03-09 DIAGNOSIS — I129 Hypertensive chronic kidney disease with stage 1 through stage 4 chronic kidney disease, or unspecified chronic kidney disease: Secondary | ICD-10-CM | POA: Diagnosis present

## 2017-03-09 DIAGNOSIS — Z79899 Other long term (current) drug therapy: Secondary | ICD-10-CM

## 2017-03-09 DIAGNOSIS — Z7982 Long term (current) use of aspirin: Secondary | ICD-10-CM

## 2017-03-09 DIAGNOSIS — R911 Solitary pulmonary nodule: Secondary | ICD-10-CM | POA: Diagnosis present

## 2017-03-09 DIAGNOSIS — M1710 Unilateral primary osteoarthritis, unspecified knee: Secondary | ICD-10-CM | POA: Diagnosis present

## 2017-03-09 LAB — BASIC METABOLIC PANEL
Anion gap: 8 (ref 5–15)
BUN: 19 mg/dL (ref 6–20)
CO2: 26 mmol/L (ref 22–32)
CREATININE: 1.26 mg/dL — AB (ref 0.61–1.24)
Calcium: 9.3 mg/dL (ref 8.9–10.3)
Chloride: 103 mmol/L (ref 101–111)
GFR calc Af Amer: 59 mL/min — ABNORMAL LOW (ref >60)
GFR, EST NON AFRICAN AMERICAN: 51 mL/min — AB (ref >60)
GLUCOSE: 120 mg/dL — AB (ref 65–99)
POTASSIUM: 4.2 mmol/L (ref 3.5–5.1)
SODIUM: 137 mmol/L (ref 135–145)

## 2017-03-09 LAB — GLUCOSE, CAPILLARY: Glucose-Capillary: 120 mg/dL — ABNORMAL HIGH (ref 65–99)

## 2017-03-09 LAB — TROPONIN I
TROPONIN I: 1.57 ng/mL — AB (ref <0.03)
Troponin I: <0.03 ng/mL (ref <0.03)
Troponin I: 2.72 ng/mL (ref <0.03)

## 2017-03-09 LAB — CBC
HEMATOCRIT: 43.3 % (ref 40.0–52.0)
Hemoglobin: 14.4 g/dL (ref 13.0–18.0)
MCH: 30.9 pg (ref 26.0–34.0)
MCHC: 33.3 g/dL (ref 32.0–36.0)
MCV: 92.8 fL (ref 80.0–100.0)
PLATELETS: 185 10*3/uL (ref 150–440)
RBC: 4.67 MIL/uL (ref 4.40–5.90)
RDW: 13.8 % (ref 11.5–14.5)
WBC: 10 10*3/uL (ref 3.8–10.6)

## 2017-03-09 LAB — TSH: TSH: 2.05 u[IU]/mL (ref 0.350–4.500)

## 2017-03-09 LAB — APTT: APTT: 29 s (ref 24–36)

## 2017-03-09 LAB — PROTIME-INR
INR: 1.1
Prothrombin Time: 14.2 seconds (ref 11.4–15.2)

## 2017-03-09 MED ORDER — NITROGLYCERIN 0.2 MG/HR TD PT24
0.2000 mg | MEDICATED_PATCH | Freq: Every day | TRANSDERMAL | Status: DC
  Administered 2017-03-10 – 2017-03-11 (×2): 0.2 mg via TRANSDERMAL
  Filled 2017-03-09 (×3): qty 1

## 2017-03-09 MED ORDER — ONDANSETRON HCL 4 MG/2ML IJ SOLN: 4.0000 mg | Freq: Four times a day (QID) | INTRAMUSCULAR | Status: DC | PRN

## 2017-03-09 MED ORDER — METOPROLOL TARTRATE 25 MG PO TABS
25.0000 mg | ORAL_TABLET | Freq: Two times a day (BID) | ORAL | Status: DC
  Administered 2017-03-11: 25 mg via ORAL
  Filled 2017-03-09 (×4): qty 1

## 2017-03-09 MED ORDER — HEPARIN SODIUM (PORCINE) 5000 UNIT/ML IJ SOLN
5000.0000 [IU] | Freq: Three times a day (TID) | INTRAMUSCULAR | Status: DC
  Filled 2017-03-09: qty 1

## 2017-03-09 MED ORDER — ACETAMINOPHEN 325 MG PO TABS
650.0000 mg | ORAL_TABLET | ORAL | Status: DC | PRN
  Administered 2017-03-10: 650 mg via ORAL
  Filled 2017-03-09: qty 2

## 2017-03-09 MED ORDER — CLOPIDOGREL BISULFATE 75 MG PO TABS
75.0000 mg | ORAL_TABLET | Freq: Every day | ORAL | Status: DC
  Administered 2017-03-10 – 2017-03-12 (×3): 75 mg via ORAL
  Filled 2017-03-09 (×3): qty 1

## 2017-03-09 MED ORDER — HEPARIN BOLUS VIA INFUSION
4000.0000 [IU] | Freq: Once | INTRAVENOUS | Status: AC
  Administered 2017-03-09: 4000 [IU] via INTRAVENOUS
  Filled 2017-03-09: qty 4000

## 2017-03-09 MED ORDER — LISINOPRIL 5 MG PO TABS
5.0000 mg | ORAL_TABLET | Freq: Every day | ORAL | Status: DC
  Administered 2017-03-10 – 2017-03-12 (×3): 5 mg via ORAL
  Filled 2017-03-09 (×3): qty 1

## 2017-03-09 MED ORDER — INSULIN ASPART 100 UNIT/ML ~~LOC~~ SOLN
0.0000 [IU] | Freq: Three times a day (TID) | SUBCUTANEOUS | Status: DC
  Administered 2017-03-10: 3 [IU] via SUBCUTANEOUS
  Administered 2017-03-11 (×3): 2 [IU] via SUBCUTANEOUS
  Filled 2017-03-09: qty 3
  Filled 2017-03-09 (×3): qty 2

## 2017-03-09 MED ORDER — ZOLPIDEM TARTRATE 5 MG PO TABS
5.0000 mg | ORAL_TABLET | Freq: Every evening | ORAL | Status: DC | PRN
  Administered 2017-03-10 – 2017-03-11 (×2): 5 mg via ORAL
  Filled 2017-03-09 (×2): qty 1

## 2017-03-09 MED ORDER — INSULIN ASPART 100 UNIT/ML ~~LOC~~ SOLN: 0.0000 [IU] | Freq: Every day | SUBCUTANEOUS | Status: DC

## 2017-03-09 MED ORDER — DOCUSATE SODIUM 100 MG PO CAPS
100.0000 mg | ORAL_CAPSULE | Freq: Every day | ORAL | Status: DC | PRN
  Administered 2017-03-10: 100 mg via ORAL
  Filled 2017-03-09: qty 1

## 2017-03-09 MED ORDER — GI COCKTAIL ~~LOC~~
30.0000 mL | Freq: Four times a day (QID) | ORAL | Status: DC | PRN
  Filled 2017-03-09: qty 30

## 2017-03-09 MED ORDER — PRAVASTATIN SODIUM 40 MG PO TABS
80.0000 mg | ORAL_TABLET | Freq: Every day | ORAL | Status: DC
  Administered 2017-03-10 – 2017-03-11 (×2): 80 mg via ORAL
  Filled 2017-03-09 (×2): qty 2

## 2017-03-09 MED ORDER — IOPAMIDOL (ISOVUE-370) INJECTION 76%
75.0000 mL | Freq: Once | INTRAVENOUS | Status: AC | PRN
  Administered 2017-03-09: 75 mL via INTRAVENOUS

## 2017-03-09 MED ORDER — ASPIRIN 81 MG PO CHEW
324.0000 mg | CHEWABLE_TABLET | Freq: Once | ORAL | Status: AC
  Administered 2017-03-09: 324 mg via ORAL
  Filled 2017-03-09: qty 4

## 2017-03-09 MED ORDER — MORPHINE SULFATE (PF) 4 MG/ML IV SOLN
2.0000 mg | INTRAVENOUS | Status: DC | PRN
Start: 2017-03-09 — End: 2017-03-12

## 2017-03-09 MED ORDER — GABAPENTIN 300 MG PO CAPS: 300.0000 mg | ORAL_CAPSULE | Freq: Every evening | ORAL | Status: DC | PRN

## 2017-03-09 MED ORDER — MEMANTINE HCL 10 MG PO TABS
10.0000 mg | ORAL_TABLET | Freq: Two times a day (BID) | ORAL | Status: DC
  Administered 2017-03-09 – 2017-03-11 (×5): 10 mg via ORAL
  Filled 2017-03-09 (×6): qty 1

## 2017-03-09 MED ORDER — SODIUM CHLORIDE 0.9 % IV SOLN
INTRAVENOUS | Status: DC
  Administered 2017-03-09: 22:00:00 via INTRAVENOUS

## 2017-03-09 MED ORDER — FENTANYL CITRATE (PF) 100 MCG/2ML IJ SOLN
25.0000 ug | INTRAMUSCULAR | Status: DC | PRN
  Administered 2017-03-09: 25 ug via INTRAVENOUS
  Filled 2017-03-09: qty 2

## 2017-03-09 MED ORDER — ALPRAZOLAM 0.25 MG PO TABS: 0.2500 mg | ORAL_TABLET | Freq: Two times a day (BID) | ORAL | Status: DC | PRN

## 2017-03-09 MED ORDER — HEPARIN (PORCINE) IN NACL 100-0.45 UNIT/ML-% IJ SOLN
850.0000 [IU]/h | INTRAMUSCULAR | Status: DC
  Administered 2017-03-09: 850 [IU]/h via INTRAVENOUS
  Filled 2017-03-09: qty 250

## 2017-03-09 MED ORDER — ASPIRIN EC 325 MG PO TBEC
325.0000 mg | DELAYED_RELEASE_TABLET | Freq: Every day | ORAL | Status: DC
  Administered 2017-03-10 – 2017-03-12 (×3): 325 mg via ORAL
  Filled 2017-03-09 (×3): qty 1

## 2017-03-09 NOTE — ED Notes (Signed)
Pt given sandwich tray and sprite.  

## 2017-03-09 NOTE — ED Notes (Signed)
Attempted to call report on patient. Charge RN in a room and cannot take report at this time, will call me back in about 5 minutes

## 2017-03-09 NOTE — Progress Notes (Signed)
Troponin=1.57 at this time. Dr. Jannifer Franklin notified with a new order to hold the sub Q heparin and start Heparin drip. Patient is asymptomatic and denied any chest pain. Patient has been sinus brady since admitted and has Metoprolol 25 mg  To be administered. Dr. Jannifer Franklin notified with a new order to hold on to it tonight. Will continue to monitor.

## 2017-03-09 NOTE — ED Notes (Signed)
Report called to floor by prior RN. Pt to  Be transported by float RN at [employer]

## 2017-03-09 NOTE — ED Notes (Signed)
Called report on pt, Don Rhodes ask that we wait until after shift change to bring pt up

## 2017-03-09 NOTE — Progress Notes (Signed)
Patient is admitted to room 234 with the diagnosis of chest pain to rule out MI. Alert and oriented  X 4 but forgetful. Tele box called to CCMD with Estill Bamberg as a second Psychologist, counselling.Patient denied any acute chest pain at this time. Password was set up. Skin assessment done with Doretha Imus RN, no skin issues to report. Will continue to monitor.

## 2017-03-09 NOTE — ED Triage Notes (Signed)
Pt brought in by [facility] from home for chest pain. Pt was out working in the yard with a chainsaw and started having chest pain. 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 Pt states that pain feels like his previous MI's. Pt currently rating pain 10/10, pt has NTG patch on that he wears every day from 12-10pm, was given 324 mg ASA and 1 NTG tablet by EMS with no improvement in the pain.   Pt in NAD at this time, VSS, color WNL

## 2017-03-09 NOTE — ED Provider Notes (Signed)
[facility] Emergency Department Provider Note    First MD Initiated Contact with Patient 03/09/17 1411     (approximate)  I have reviewed the triage vital signs and the nursing notes.   HISTORY  Chief Complaint Chest Pain    HPI Don Rhodes is a 83 y.o. male With a history of CAD status post 3 stents presents with acute onset chest pain and pressure that occurred while the patient was trying to cut down a tree. States that his chainsaw got jammed in the tree so he had to get a hand saw to try to cut the tree down. When using the hand saw he started feeling an aching pressure in his chest that caused him to have to sit down. States that he was grabbing his chest and provide tear to severe. States it feels like his previous heart attack. Is still having some pain and pressure. No pain radiating through to his back or migration of the pain.7/10 in severity.   Past Medical History:  Diagnosis Date  . Blockage of coronary artery of heart ([place])   . Cancer [facility])    prostate  . Dementia   . Diabetes mellitus without complication ([place])    No family history on file. Past Surgical History:  Procedure Laterality Date  . back operation    . CAROTID STENT    . COLECTOMY    . HERNIA REPAIR    . KNEE SURGERY     There are no active problems to display for this patient.     Prior to Admission medications   Medication Sig Start Date End Date Taking? Authorizing Provider  aspirin 81 MG tablet Take 81 mg by mouth daily.   Yes Historical Provider, MD  clopidogrel (PLAVIX) 75 MG tablet Take 75 mg by mouth daily.   Yes Historical Provider, MD  docusate sodium (COLACE) 100 MG capsule Take 100 mg by mouth daily as needed for mild constipation.   Yes Historical Provider, MD  gabapentin (NEURONTIN) 300 MG capsule Take 300 mg by mouth at bedtime as needed.   Yes Historical Provider, MD  glipiZIDE (GLUCOTROL XL) 2.5 MG 24 hr tablet Take 2.5 mg by mouth daily  with breakfast.   Yes Historical Provider, MD  lisinopril (PRINIVIL,ZESTRIL) 5 MG tablet Take 5 mg by mouth daily.   Yes Historical Provider, MD  memantine (NAMENDA) 10 MG tablet Take 10 mg by mouth 2 (two) times daily.   Yes Historical Provider, MD  nitroGLYCERIN (NITRODUR - DOSED IN MG/24 HR) 0.2 mg/hr patch Place 0.2 mg onto the skin daily.   Yes Historical Provider, MD  pravastatin (PRAVACHOL) 40 MG tablet Take 80 mg by mouth daily.   Yes Historical Provider, MD  zolpidem (AMBIEN) 5 MG tablet Take 5 mg by mouth at bedtime as needed. 01/22/17  Yes Historical Provider, MD  sucralfate (CARAFATE) 1 g tablet Take 1 tablet (1 g total) by mouth 4 (four) times daily. Patient not taking: Reported on 03/09/2017 01/03/16   Carrie Mew, MD    Allergies Benadryl [diphenhydramine]    Social History Social History  Substance Use Topics  . Smoking status: Never Smoker  . Smokeless tobacco: Never Used  . Alcohol use No    Review of Systems Patient denies headaches, rhinorrhea, blurry vision, numbness, shortness of breath, chest pain, edema, cough, abdominal pain, nausea, vomiting, diarrhea, dysuria, fevers, rashes or hallucinations unless otherwise stated above in HPI. ____________________________________________   PHYSICAL EXAM:  VITAL SIGNS: Vitals:  03/09/17 1412 03/09/17 1417  BP:  116/66  Pulse: 60   Resp: 14   Temp: 97.8 F (36.6 C)     Constitutional: Alert and oriented uncomfortable appearing but in no acute distress. Eyes: Conjunctivae are normal. PERRL. EOMI. Head: Atraumatic. Nose: No congestion/rhinnorhea. Mouth/Throat: Mucous membranes are moist.  Oropharynx non-erythematous. Neck: No stridor. Painless ROM. No cervical spine tenderness to palpation Hematological/Lymphatic/Immunilogical: No cervical lymphadenopathy. Cardiovascular: Normal rate, regular rhythm. Grossly normal heart sounds.  Good peripheral circulation. Respiratory: Normal respiratory effort.  No  retractions. Lungs CTAB. Gastrointestinal: Soft and nontender. No distention. No abdominal bruits. No CVA tenderness. Musculoskeletal: No lower extremity tenderness nor edema.  No joint effusions. Neurologic:  Normal speech and language. No gross focal neurologic deficits are appreciated. No gait instability. Skin:  Skin is warm, dry and intact. No rash noted. Psychiatric: Mood and affect are normal. Speech and behavior are normal.  ____________________________________________   LABS (all labs ordered are listed, but only abnormal results are displayed)  Results for orders placed or performed during the hospital encounter of 03/09/17 (from the past 24 hour(s))  Basic metabolic panel     Status: Abnormal   Collection Time: 03/09/17  2:14 PM  Result Value Ref Range   Sodium 137 135 - 145 mmol/L   Potassium 4.2 3.5 - 5.1 mmol/L   Chloride 103 101 - 111 mmol/L   CO2 26 22 - 32 mmol/L   Glucose, Bld 120 (H) 65 - 99 mg/dL   BUN 19 6 - 20 mg/dL   Creatinine, Ser 1.26 (H) 0.61 - 1.24 mg/dL   Calcium 9.3 8.9 - 10.3 mg/dL   GFR calc non Af Amer 51 (L) >60 mL/min   GFR calc Af Amer 59 (L) >60 mL/min   Anion gap 8 5 - 15  CBC     Status: None   Collection Time: 03/09/17  2:14 PM  Result Value Ref Range   WBC 10.0 3.8 - 10.6 K/uL   RBC 4.67 4.40 - 5.90 MIL/uL   Hemoglobin 14.4 13.0 - 18.0 g/dL   HCT 43.3 40.0 - 52.0 %   MCV 92.8 80.0 - 100.0 fL   MCH 30.9 26.0 - 34.0 pg   MCHC 33.3 32.0 - 36.0 g/dL   RDW 13.8 11.5 - 14.5 %   Platelets 185 150 - 440 K/uL  Troponin I     Status: None   Collection Time: 03/09/17  2:14 PM  Result Value Ref Range   Troponin I <0.03 <0.03 ng/mL   ____________________________________________  EKG My review and personal interpretation at Time: 14:12   Indication: chest pain  Rate: 55  Rhythm: sinus Axis: left Other: normal intervals, IVCD no STEMI ____________________________________________  RADIOLOGY  I personally reviewed all radiographic images  ordered to evaluate for the above acute complaints and reviewed radiology reports and findings.  These findings were personally discussed with the patient.  Please see medical record for radiology report.  ____________________________________________   PROCEDURES  Procedure(s) performed:  Procedures    Critical Care performed: no ____________________________________________   INITIAL IMPRESSION / ASSESSMENT AND PLAN / ED COURSE  Pertinent labs & imaging results that were available during my care of the patient were reviewed by me and considered in my medical decision making (see chart for details).  DDX: ACS, pericarditis, esophagitis, boerhaaves, pe, dissection, pna, bronchitis, costochondritis   Don Rhodes is a 83 y.o. who presents to the ED with chest pain as described above.  Patient is AFVSS in  ED. Exam as above. Given current presentation have considered the above differential.  EKG and initial trop negative but presentation and description is concerning for unstable angina.  Will give asa and control BP.  Will heparinize if unable to get pain free.  The patient will be placed on continuous pulse oximetry and telemetry for monitoring.  Laboratory evaluation will be sent to evaluate for the above complaints.     Clinical Course as of Mar 09 2148  Fri Mar 09, 2017  1518 Patient states that pain has improved but now currently 5 out of 10 in severity. States that he is having some pain radiating through to his back. Will order CT angiogram evaluate for dissection.  [PR]  1603 CT angiogram with no evidence of dissection or aneurysm.  Patient will need admission for further evaluation of his high risk chest pain.Pain is current 10 in severity.  BP improved.  Patient will require admission for further risk stratification for his chest pain.  Have discussed with the patient and available family all diagnostics and treatments performed thus far and all questions were answered to  the best of my ability. The patient demonstrates understanding and agreement with plan.   [PR]    Clinical Course User Index [PR] Merlyn Lot, MD     ____________________________________________   FINAL CLINICAL IMPRESSION(S) / ED DIAGNOSES  Final diagnoses:  Chest pain, unspecified type      NEW MEDICATIONS STARTED DURING THIS VISIT:  New Prescriptions   No medications on file     Note:  This document was prepared using Dragon voice recognition software and may include unintentional dictation errors.    Merlyn Lot, MD 03/09/17 2149

## 2017-03-09 NOTE — H&P (Signed)
[place] @ [facility] Admission History and Physical [employer], D.O.  ---------------------------------------------------------------------------------------------------------------------   PATIENT NAME: Don Rhodes MR#: [id number] DATE OF BIRTH: 06-Mar-1930 DATE OF ADMISSION: 03/09/2017 PRIMARY CARE PHYSICIAN: Pcp Not In System  REQUESTING/REFERRING PHYSICIAN: ED Dr. Quentin Cornwall  CHIEF COMPLAINT: Chief Complaint  Patient presents with  . Chest Pain    HISTORY OF PRESENT ILLNESS: Don Rhodes is a 83 y.o. male with a known history of CAD s/p 3 stents, dementia, DM, anxiety, CKD3, GERD presents to the emergency department for evaluation of chest pain.  Patient was in a usual state of health until this afternoon when he experienced aching left chest pain radiating to left arm and through to his back while he was using a hand saw to saw a tree down. No associated nausea, SOB or diaphoresis.  Pain was relieved by fentanyl in the ED.  He also complains of GI discomfort which just started after being here in the ED for over four hours.   Patient denies fevers/chills, weakness, dizziness, shortness of breath, N/V/C/D, dysuria/frequency, changes in mental status  Otherwise there has been no change in status. Patient has been taking medication as prescribed and there has been no recent change in medication or diet.  There has been no recent illness, travel or sick contacts.    EMS/ED COURSE:   Patient received aspirin 324mg , fentanyl 25.  PAST MEDICAL HISTORY: Past Medical History:  Diagnosis Date  . Blockage of coronary artery of heart ([place])   . Cancer [facility])    prostate  . Dementia   . Diabetes mellitus without complication [facility])    Active Problems Reconcile with Patient's Chart  Problem Noted Date  Diabetes ([facility]-HCC) 04/21/2016  Prostate cancer ([facility]-HCC) 04/21/2016  Hyperlipidemia ([facility]-HCC) 04/21/2016  RLS (restless legs syndrome) 04/21/2016  Right bundle  branch block 04/21/2016  OA (osteoarthritis) of knee 04/21/2016  Vitamin B 12 deficiency 04/21/2016  CAD (coronary artery disease) ([facility]-HCC) 04/21/2016  Anxiety 04/21/2016  Urinary incontinence 04/21/2016  Memory loss 04/21/2016  CKD (chronic kidney disease) stage 3, GFR 30-59 ml/min 04/21/2016  Schatzki's ring ([facility]-HCC) 04/21/2016  GERD (gastroesophageal reflux disease) ([facility]-HCC) 04/21/2016  Trigger ring finger 04/21/2016  Constipation 04/21/2016  Chronic idiopathic constipation in patient with multiple surgical procedures, and history of recurrent prostate cancer with rising PSA 04/21/2016  Overview:   Could be related to adhesions, rule out obstructions     Surgical History   Surgery Date Laterality Comments  NECK SURGERY     SHOULDER SURGERY   left shoulder  BACK SURGERY   x 2 lower  heart stent     BALLOON ANGIOPLASTY, ARTERY     KNEE SURGERY   right knee  EYE SURGERY   right eye  CARDIAC SURGERY     COLON SURGERY   10 incches removed & colostomy & take down  HERNIA REPAIR   right & left inguinal hernia repair  JOINT REPLACEMENT   right knee  PR COLONOSCOPY FLX DX W/COLLJ SPEC WHEN PFRMD 06/06/2016        SOCIAL HISTORY: Social History  Substance Use Topics  . Smoking status: Never Smoker  . Smokeless tobacco: Never Used  . Alcohol use No   Family History   Medical History Relation Name Comments  Breast cancer Daughter    Cancer Daughter    Coronary Artery Disease (Blocked arteries around heart) Father    Myocardial Infarction (Heart attack) Father    Diabetes Sister    Diabetes type II Sister  High blood pressure (Hypertension) Sister    Cancer Son       MEDICATIONS AT HOME: Prior to Admission medications   Medication Sig Start Date End Date Taking? Authorizing Provider  aspirin 81 MG tablet Take 81 mg by mouth daily.   Yes Historical Provider, MD  clopidogrel (PLAVIX) 75 MG tablet Take 75 mg by mouth  daily.   Yes Historical Provider, MD  docusate sodium (COLACE) 100 MG capsule Take 100 mg by mouth daily as needed for mild constipation.   Yes Historical Provider, MD  gabapentin (NEURONTIN) 300 MG capsule Take 300 mg by mouth at bedtime as needed.   Yes Historical Provider, MD  glipiZIDE (GLUCOTROL XL) 2.5 MG 24 hr tablet Take 2.5 mg by mouth daily with breakfast.   Yes Historical Provider, MD  lisinopril (PRINIVIL,ZESTRIL) 5 MG tablet Take 5 mg by mouth daily.   Yes Historical Provider, MD  memantine (NAMENDA) 10 MG tablet Take 10 mg by mouth 2 (two) times daily.   Yes Historical Provider, MD  nitroGLYCERIN (NITRODUR - DOSED IN MG/24 HR) 0.2 mg/hr patch Place 0.2 mg onto the skin daily.   Yes Historical Provider, MD  pravastatin (PRAVACHOL) 40 MG tablet Take 80 mg by mouth daily.   Yes Historical Provider, MD  zolpidem (AMBIEN) 5 MG tablet Take 5 mg by mouth at bedtime as needed. 01/22/17  Yes Historical Provider, MD  sucralfate (CARAFATE) 1 g tablet Take 1 tablet (1 g total) by mouth 4 (four) times daily. Patient not taking: Reported on 03/09/2017 01/03/16   Carrie Mew, MD      DRUG ALLERGIES: Allergies  Allergen Reactions  . Benadryl [Diphenhydramine] Rash     REVIEW OF SYSTEMS: CONSTITUTIONAL: No fatigue, weakness, fever, chills, weight gain/loss, headache EYES: No blurry or double vision. ENT: No tinnitus, postnasal drip, redness or soreness of the oropharynx. RESPIRATORY: No dyspnea, cough, wheeze, hemoptysis. CARDIOVASCULAR: Positive chest pain, negative orthopnea, palpitations, syncope. GASTROINTESTINAL: No nausea, vomiting, constipation, diarrhea, abdominal pain. No hematemesis, melena or hematochezia. GENITOURINARY: No dysuria, frequency, hematuria. ENDOCRINE: No polyuria or nocturia. No heat or cold intolerance. HEMATOLOGY: No anemia, bruising, bleeding. INTEGUMENTARY: No rashes, ulcers, lesions. MUSCULOSKELETAL: No pain, arthritis, swelling, gout. NEUROLOGIC: No  numbness, tingling, weakness or ataxia. No seizure-type activity. PSYCHIATRIC: No anxiety, depression, insomnia.  PHYSICAL EXAMINATION: VITAL SIGNS: Blood pressure 99/60, pulse (!) 58, temperature 97.8 F (36.6 C), temperature source Oral, resp. rate 16, weight 84.6 kg (186 lb 8.2 oz), SpO2 98 %.  GENERAL: 83 y.o.-year-old male patient, well-developed, well-nourished lying in the bed in no acute distress.  Pleasant and cooperative.   HEENT: Head atraumatic, normocephalic. Pupils equal, round, reactive to light and accommodation. No scleral icterus. Extraocular muscles intact. Oropharynx is clear. Mucus membranes moist. NECK: Supple, full range of motion. No JVD, no bruit heard. No cervical lymphadenopathy. CHEST: Normal breath sounds bilaterally. No wheezing, rales, rhonchi or crackles. No use of accessory muscles of respiration.  Mild reproducible chest wall tenderness on the left anterior chest wall. CARDIOVASCULAR: S1, S2 normal. No murmurs, rubs, or gallops appreciated. Cap refill <2 seconds. ABDOMEN: Soft, nontender, mildly distended. No rebound, guarding, rigidity. Normoactive bowel sounds present in all four quadrants. No organomegaly or mass. EXTREMITIES: Full range of motion. No pedal edema, cyanosis, or clubbing. NEUROLOGIC: Cranial nerves II through XII are grossly intact with no focal sensorimotor deficit. Muscle strength 5/5 in all extremities. Sensation intact. Gait not checked. PSYCHIATRIC: The patient is alert and oriented x 3. Normal affect, mood, thought content.  SKIN: Warm, dry, and intact without obvious rash, lesion, or ulcer.  LABORATORY PANEL:  CBC  Recent Labs Lab 03/09/17 1414  WBC 10.0  HGB 14.4  HCT 43.3  PLT 185   ----------------------------------------------------------------------------------------------------------------- Chemistries  Recent Labs Lab 03/09/17 1414  NA 137  K 4.2  CL 103  CO2 26  GLUCOSE 120*  BUN 19  CREATININE 1.26*    CALCIUM 9.3   ------------------------------------------------------------------------------------------------------------------ Cardiac Enzymes  Recent Labs Lab 03/09/17 1414  TROPONINI <0.03   ------------------------------------------------------------------------------------------------------------------  RADIOLOGY: Dg Chest 2 View  Result Date: 03/09/2017 CLINICAL DATA:  Left chest pain radiating to the back on left shoulder beginning today. EXAM: CHEST  2 VIEW COMPARISON:  PA and lateral chest 10/08/2015. FINDINGS: The lungs are clear. Heart size is normal. No pneumothorax or pleural effusion. Aortic atherosclerosis noted. No acute bony abnormality. IMPRESSION: No acute disease. Atherosclerosis. Electronically Signed   By: Inge Rise M.D.   On: 03/09/2017 14:56   Ct Angio Chest Aorta W And/or Wo Contrast  Result Date: 03/09/2017 CLINICAL DATA:  Chest and back pain. EXAM: CT ANGIOGRAPHY CHEST WITH CONTRAST TECHNIQUE: Multidetector CT imaging of the chest was performed using the standard protocol during bolus administration of intravenous contrast. Multiplanar CT image reconstructions and MIPs were obtained to evaluate the vascular anatomy. CONTRAST:  75 mL of Isovue 370 intravenously. COMPARISON:  CT scan of November 08, 2011. FINDINGS: Cardiovascular: Atherosclerosis of thoracic aorta is noted without aneurysm or dissection. Great vessels are widely patent without significant stenosis. Coronary artery calcifications are noted. No pericardial effusion is noted. Mediastinum/Nodes: No enlarged mediastinal, hilar, or axillary lymph nodes. Thyroid gland, trachea, and esophagus demonstrate no significant findings. Lungs/Pleura: No pneumothorax or pleural effusion is noted. Stable calcified granuloma is noted in left upper lobe laterally. Mild biapical scarring is noted in stable. 7 mm subpleural nodule is seen in the right lower lobe best seen on image number 45 of series 7. Upper Abdomen:  No acute abnormality. Musculoskeletal: No chest wall abnormality. No acute or significant osseous findings. Review of the MIP images confirms the above findings. IMPRESSION: Aortic atherosclerosis. No evidence of thoracic aortic dissection or aneurysm. Coronary artery calcifications are noted suggesting coronary artery disease. 7 mm subpleural nodule seen in right lower lobe. Non-contrast chest CT at 6-12 months is recommended. If the nodule is stable at time of repeat CT, then future CT at 18-24 months (from today's scan) is considered optional for low-risk patients, but is recommended for high-risk patients. This recommendation follows the consensus statement: Guidelines for Management of Incidental Pulmonary Nodules Detected on CT Images: From the Fleischner Society 2017; Radiology 2017; 284:228-243. Electronically Signed   By: Marijo Conception, M.D.   On: 03/09/2017 15:56    EKG: Normal sinus rhythm at 55 bpm with leftward axis, interventricular conduction delay and nonspecific ST-T wave changes.   IMPRESSION AND PLAN:  This is a 83 y.o. male with a history of CAD, dementia, DM, anxiety, CKD3, GERD now being admitted with:  #. Chest pain, rule out ACS in a patient with h/o CAD - Admit to observation with telemetry monitoring. - Trend troponins, check lipids and TSH. - Morphine, nitro, beta blocker added.   - Continue Plavix, aspirin, beta blocker, statin - Check echo. - Cardiology consult requested - follows with Dr. Ubaldo Glassing  #. H/o Diabetes - Accuchecks achs with RISS coverage - Heart healthy, carb controlled diet  #. H/O HTN - Continue Lisinopril  #. H/O dementia - Continue Namenda  #. H/O  HLD - Continue pravastatin  #. H/O CKD, stable baseline - Monitor BMP  #. Pulmonary nodule identified on CT will need follow up imaging in 6-12 months  Admission status: Observation, telemetry Diet/Nutrition: Heart healthy Fluids: NS DVT Px: Heparin, SCDs and early ambulation Code Status:  Full Disposition Plan: To home in <24 hours  All the records are reviewed and case discussed with ED provider. Management plans discussed with the patient and/or family who express understanding and agree with plan of care.   TOTAL TIME TAKING CARE OF THIS PATIENT: 60 minutes.   Kelsay Haggard D.O. on 03/09/2017 at 4:39 PM Between 7am to 6pm - Pager - [phone] After 6pm go to [email or web address] - Proofreader [facility] [place] Office [phone] CC: Primary care physician; Pcp Not In System     Note: This dictation was prepared with Dragon dictation along with smaller phrase technology. Any transcriptional errors that result from this process are unintentional.

## 2017-03-10 ENCOUNTER — Inpatient Hospital Stay
Admit: 2017-03-10 | Discharge: 2017-03-10 | Disposition: A | Discharge status: Home / Self Care | Payer: Medicare Other | Attending: Family Medicine | Admitting: Family Medicine

## 2017-03-10 DIAGNOSIS — T426X5A Adverse effect of other antiepileptic and sedative-hypnotic drugs, initial encounter: Secondary | ICD-10-CM | POA: Diagnosis not present

## 2017-03-10 DIAGNOSIS — Z833 Family history of diabetes mellitus: Secondary | ICD-10-CM | POA: Diagnosis not present

## 2017-03-10 DIAGNOSIS — R911 Solitary pulmonary nodule: Secondary | ICD-10-CM | POA: Diagnosis present

## 2017-03-10 DIAGNOSIS — F0390 Unspecified dementia without behavioral disturbance: Secondary | ICD-10-CM | POA: Diagnosis present

## 2017-03-10 DIAGNOSIS — Z803 Family history of malignant neoplasm of breast: Secondary | ICD-10-CM | POA: Diagnosis not present

## 2017-03-10 DIAGNOSIS — K219 Gastro-esophageal reflux disease without esophagitis: Secondary | ICD-10-CM | POA: Diagnosis present

## 2017-03-10 DIAGNOSIS — I252 Old myocardial infarction: Secondary | ICD-10-CM | POA: Diagnosis not present

## 2017-03-10 DIAGNOSIS — Z79899 Other long term (current) drug therapy: Secondary | ICD-10-CM | POA: Diagnosis not present

## 2017-03-10 DIAGNOSIS — Z7984 Long term (current) use of oral hypoglycemic drugs: Secondary | ICD-10-CM | POA: Diagnosis not present

## 2017-03-10 DIAGNOSIS — Z8546 Personal history of malignant neoplasm of prostate: Secondary | ICD-10-CM | POA: Diagnosis not present

## 2017-03-10 DIAGNOSIS — F419 Anxiety disorder, unspecified: Secondary | ICD-10-CM | POA: Diagnosis present

## 2017-03-10 DIAGNOSIS — Z8249 Family history of ischemic heart disease and other diseases of the circulatory system: Secondary | ICD-10-CM | POA: Diagnosis not present

## 2017-03-10 DIAGNOSIS — N183 Chronic kidney disease, stage 3 (moderate): Secondary | ICD-10-CM | POA: Diagnosis present

## 2017-03-10 DIAGNOSIS — I214 Non-ST elevation (NSTEMI) myocardial infarction: Secondary | ICD-10-CM | POA: Diagnosis present

## 2017-03-10 DIAGNOSIS — I4510 Unspecified right bundle-branch block: Secondary | ICD-10-CM | POA: Diagnosis present

## 2017-03-10 DIAGNOSIS — Z7902 Long term (current) use of antithrombotics/antiplatelets: Secondary | ICD-10-CM | POA: Diagnosis not present

## 2017-03-10 DIAGNOSIS — I2510 Atherosclerotic heart disease of native coronary artery without angina pectoris: Secondary | ICD-10-CM | POA: Diagnosis present

## 2017-03-10 DIAGNOSIS — E1122 Type 2 diabetes mellitus with diabetic chronic kidney disease: Secondary | ICD-10-CM | POA: Diagnosis present

## 2017-03-10 DIAGNOSIS — Z7982 Long term (current) use of aspirin: Secondary | ICD-10-CM | POA: Diagnosis not present

## 2017-03-10 DIAGNOSIS — E785 Hyperlipidemia, unspecified: Secondary | ICD-10-CM | POA: Diagnosis present

## 2017-03-10 DIAGNOSIS — Y92230 Patient room in hospital as the place of occurrence of the external cause: Secondary | ICD-10-CM | POA: Diagnosis not present

## 2017-03-10 DIAGNOSIS — Z955 Presence of coronary angioplasty implant and graft: Secondary | ICD-10-CM | POA: Diagnosis not present

## 2017-03-10 DIAGNOSIS — F05 Delirium due to known physiological condition: Secondary | ICD-10-CM | POA: Diagnosis not present

## 2017-03-10 DIAGNOSIS — M1710 Unilateral primary osteoarthritis, unspecified knee: Secondary | ICD-10-CM | POA: Diagnosis present

## 2017-03-10 DIAGNOSIS — R079 Chest pain, unspecified: Secondary | ICD-10-CM | POA: Diagnosis present

## 2017-03-10 DIAGNOSIS — I129 Hypertensive chronic kidney disease with stage 1 through stage 4 chronic kidney disease, or unspecified chronic kidney disease: Secondary | ICD-10-CM | POA: Diagnosis present

## 2017-03-10 LAB — TROPONIN I: TROPONIN I: 3.91 ng/mL — AB (ref <0.03)

## 2017-03-10 LAB — CBC
HCT: 44.1 % (ref 40.0–52.0)
Hemoglobin: 14.7 g/dL (ref 13.0–18.0)
MCH: 30.9 pg (ref 26.0–34.0)
MCHC: 33.4 g/dL (ref 32.0–36.0)
MCV: 92.5 fL (ref 80.0–100.0)
PLATELETS: 175 10*3/uL (ref 150–440)
RBC: 4.77 MIL/uL (ref 4.40–5.90)
RDW: 13.5 % (ref 11.5–14.5)
WBC: 8.4 10*3/uL (ref 3.8–10.6)

## 2017-03-10 LAB — HEPARIN LEVEL (UNFRACTIONATED)
HEPARIN UNFRACTIONATED: 0.34 [IU]/mL (ref 0.30–0.70)
Heparin Unfractionated: <0.1 IU/mL — ABNORMAL LOW (ref 0.30–0.70)

## 2017-03-10 LAB — LIPID PANEL
Cholesterol: 133 mg/dL (ref 0–200)
HDL: 34 mg/dL — ABNORMAL LOW (ref >40)
LDL CALC: 88 mg/dL (ref 0–99)
Total CHOL/HDL Ratio: 3.9 RATIO
Triglycerides: 53 mg/dL (ref <150)
VLDL: 11 mg/dL (ref 0–40)

## 2017-03-10 LAB — GLUCOSE, CAPILLARY
GLUCOSE-CAPILLARY: 107 mg/dL — AB (ref 65–99)
GLUCOSE-CAPILLARY: 89 mg/dL (ref 65–99)
Glucose-Capillary: 173 mg/dL — ABNORMAL HIGH (ref 65–99)
Glucose-Capillary: 96 mg/dL (ref 65–99)

## 2017-03-10 MED ORDER — POLYETHYLENE GLYCOL 3350 17 G PO PACK
17.0000 g | PACK | Freq: Every day | ORAL | Status: DC | PRN
  Administered 2017-03-10 – 2017-03-11 (×2): 17 g via ORAL
  Filled 2017-03-10 (×2): qty 1

## 2017-03-10 MED ORDER — HEPARIN (PORCINE) IN NACL 100-0.45 UNIT/ML-% IJ SOLN
1250.0000 [IU]/h | INTRAMUSCULAR | Status: DC
  Administered 2017-03-10 – 2017-03-11 (×3): 1100 [IU]/h via INTRAVENOUS
  Filled 2017-03-10 (×2): qty 250

## 2017-03-10 MED ORDER — HEPARIN BOLUS VIA INFUSION
2100.0000 [IU] | Freq: Once | INTRAVENOUS | Status: AC
  Administered 2017-03-10: 2100 [IU] via INTRAVENOUS
  Filled 2017-03-10: qty 2100

## 2017-03-10 NOTE — Consult Note (Signed)
[name]  CARDIOLOGY CONSULT NOTE  Patient ID: Don Rhodes MRN: [id number] DOB/AGE: 27-Feb-1930 83 y.o.  Admit date: 03/09/2017 Referring Physician Dr. Nyra Jabs Primary Physician   Primary Cardiologist Dr. Ubaldo Glassing Reason for Consultation NSTEMI  HPI: The patient is a 83 year old male with history of coronary artery disease status post PCI of the proximal LAD in 2015 with a drug-eluting stent. He was on aspirin and Plavix for dual antiplatelet therapy for one year. Plavix was discontinued and he remained on aspirin. He had mild chronic kidney disease and was recently placed on lisinopril for this. He had been doing well from an ischemic standpoint until presenting to the emergency room yesterday after developing 10 out of 10 chest pain with a weight on his chest. Initial electrocardiogram was unremarkable. Initial troponin revealed 0.03 but subsequently increased to a peak of re-0.91. He has some lower back pain but no midsternal chest pain. Electrocardiogram now reveals honest rhythm with nonspecific ST-T wave changes. Patient is hemodynamically stable and was placed on Plavix on admission.  Review of Systems  Constitutional: Negative.   HENT: Negative.   Eyes: Negative.   Respiratory: Negative.   Cardiovascular: Positive for chest pain.  Gastrointestinal: Negative.   Genitourinary: Negative.   Musculoskeletal: Positive for back pain.  Skin: Negative.   Neurological: Negative.   Endo/Heme/Allergies: Negative.   Psychiatric/Behavioral: Negative.     Past Medical History:  Diagnosis Date  . Blockage of coronary artery of heart ([place])   . Cancer [facility])    prostate  . Dementia   . Diabetes mellitus without complication ([place])     No family history on file.  Social History   Social History  . Marital status: Widowed    Spouse name: N/A  . Number of children: N/A  . Years of education: N/A   Occupational History  . Not on file.    Social History Main Topics  . Smoking status: Never Smoker  . Smokeless tobacco: Never Used  . Alcohol use No  . Drug use: No  . Sexual activity: Not on file   Other Topics Concern  . Not on file   Social History Narrative  . No narrative on file    Past Surgical History:  Procedure Laterality Date  . back operation    . CAROTID STENT    . COLECTOMY    . HERNIA REPAIR    . KNEE SURGERY       Prescriptions Prior to Admission  Medication Sig Dispense Refill Last Dose  . aspirin 81 MG tablet Take 81 mg by mouth daily.   03/09/2017 at am  . clopidogrel (PLAVIX) 75 MG tablet Take 75 mg by mouth daily.     Marland Kitchen docusate sodium (COLACE) 100 MG capsule Take 100 mg by mouth daily as needed for mild constipation.   03/09/2017 at am  . gabapentin (NEURONTIN) 300 MG capsule Take 300 mg by mouth at bedtime as needed.   prn at prn  . glipiZIDE (GLUCOTROL XL) 2.5 MG 24 hr tablet Take 2.5 mg by mouth daily with breakfast.   03/09/2017 at am  . lisinopril (PRINIVIL,ZESTRIL) 5 MG tablet Take 5 mg by mouth daily.   03/09/2017 at am  . memantine (NAMENDA) 10 MG tablet Take 10 mg by mouth 2 (two) times daily.   03/09/2017 at am  . nitroGLYCERIN (NITRODUR - DOSED IN MG/24 HR) 0.2 mg/hr patch Place 0.2 mg onto the skin daily.     Marland Kitchen  pravastatin (PRAVACHOL) 40 MG tablet Take 80 mg by mouth daily.   03/09/2017 at am  . zolpidem (AMBIEN) 5 MG tablet Take 5 mg by mouth at bedtime as needed.   prn at prn  . sucralfate (CARAFATE) 1 g tablet Take 1 tablet (1 g total) by mouth 4 (four) times daily. (Patient not taking: Reported on 03/09/2017) 120 tablet 1 Completed Course at Unknown time    Physical Exam: Blood pressure 139/64, pulse (!) 52, temperature 98 F (36.7 C), temperature source Oral, resp. rate 19, height 5' 1.32" (1.558 m), weight 81.4 kg (179 lb 8 oz), SpO2 96 %.   Wt Readings from Last 1 Encounters:  03/09/17 81.4 kg (179 lb 8 oz)     General appearance: alert and cooperative Head:  Normocephalic, without obvious abnormality, atraumatic Resp: clear to auscultation bilaterally Chest wall: no tenderness Cardio: regular rate and rhythm Extremities: extremities normal, atraumatic, no cyanosis or edema Pulses: 2+ and symmetric Neurologic: Grossly normal  Labs:   Lab Results  Component Value Date   WBC 8.4 03/10/2017   HGB 14.7 03/10/2017   HCT 44.1 03/10/2017   MCV 92.5 03/10/2017   PLT 175 03/10/2017    Recent Labs Lab 03/09/17 1414  NA 137  K 4.2  CL 103  CO2 26  BUN 19  CREATININE 1.26*  CALCIUM 9.3  GLUCOSE 120*   Lab Results  Component Value Date   CKTOTAL 88 11/18/2014   CKMB 2.9 11/18/2014   TROPONINI 3.91 ([place]) 03/10/2017      Radiology: Chest x-ray revealed no acute cardiopulmonary disease. Chest CT angiogram revealed aortic atherosclerosis with no thoracic aortic dissection or aneurysm. Coronary calcifications were noted. It was a 7 mm subpleural nodule in the right lower lobe incidentally noted. Granulomas in the left upper lobe were also noted.   EKG: AG revealed sinus rhythm with nonspecific T-wave changes.  ASSESSMENT AND PLAN:  Patient is an 83 year old male with history of an LAD stent in the proximal location. Now admitted with chest pain and is ruled out in for non-ST elevation myocardial infarction. He is currently on evidence-based medications including aspirin, Plavix, high intensity statin, beta blocker and ACE inhibitor. He has mild renal insufficiency. Will continue with heparin and aspirin and Plavix since this is already been started. Will evaluate LV function with an echocardiogram and proceed with left cardiac catheterization on Monday when lab available to guide further therapy regarding treatment. Further recommendations will be discussed at that time. Signed: Teodoro Spray MD, [facility] 03/10/2017, 11:33 AM

## 2017-03-10 NOTE — Progress Notes (Addendum)
ANTICOAGULATION CONSULT NOTE - Follow Up Consult  Pharmacy Consult for heparin drip Indication: chest pain/ACS  Allergies  Allergen Reactions  . Benadryl [Diphenhydramine] Rash    Patient Measurements: Height: 5' 1.32" (155.8 cm) (stated weight) Weight: 179 lb 8 oz (81.4 kg) IBW/kg (Calculated) : 53.04 Heparin Dosing Weight: 70kg  Vital Signs: Temp: 97.8 F (36.6 C) (04/21 1321) Temp Source: Oral (04/21 1321) BP: 106/63 (04/21 1321) Pulse Rate: 58 (04/21 1321)  Labs:  Recent Labs  03/09/17 1414 03/09/17 2012 03/09/17 2209 03/10/17 0158 03/10/17 0801 03/10/17 1815  HGB 14.4  --   --   --  14.7  --   HCT 43.3  --   --   --  44.1  --   PLT 185  --   --   --  175  --   APTT  --   --  29  --   --   --   LABPROT  --   --  14.2  --   --   --   INR  --   --  1.10  --   --   --   HEPARINUNFRC  --   --   --   --  <0.10* 0.34  CREATININE 1.26*  --   --   --   --   --   TROPONINI <0.03 1.57* 2.72* 3.91*  --   --     Estimated Creatinine Clearance: 39.8 mL/min (A) (by C-G formula based on SCr of 1.26 mg/dL (H)).   Medical History: Past Medical History:  Diagnosis Date  . Blockage of coronary artery of heart ([place])   . Cancer [facility])    prostate  . Dementia   . Diabetes mellitus without complication ([facility])     Medications:  No anticoagulation in PTA meds.  Assessment:  Goal of Therapy:  Heparin level 0.3-0.7 units/ml Monitor platelets by anticoagulation protocol: Yes   Plan:  4000 unit bolus and initial rate of 850 units/hr. First heparin level 8 hours after start of infusion.  4/21 08:00 HL <0.1. Ordered a bolus of 2100 units and increased drip rate to 1100 units/hr.  Will recheck HL in 8 hours on 4/21 at 18:00.   4/21 1815 HL therapeutic at 0.34. Will continue current rate of 1100 units/hr and recheck confirmatory level in 8 hours.   04/22 0300 H: 0.34. Continue current regimen. Recheck heparin level and CBC with 4/23 AM labs.   Sim Boast, PharmD, BCPS   03/11/17 6:14 AM

## 2017-03-10 NOTE — Progress Notes (Signed)
[facility] [place] NAME: Don Rhodes    MR#:  [id number]  DATE OF BIRTH:  [birth year]  SUBJECTIVE:   Patient here due to chest pain and has ruled in for a non-ST elevation MI. Currently chest pain-free, hemodynamically stable. Seen by cardiology and plan for cardiac catheterization on Monday. No other acute complaints presently, daughter at bedside.  REVIEW OF SYSTEMS:    Review of Systems  Constitutional: Negative for chills and fever.  HENT: Negative for congestion and tinnitus.   Eyes: Negative for blurred vision and double vision.  Respiratory: Negative for cough, shortness of breath and wheezing.   Cardiovascular: Negative for chest pain, orthopnea and PND.  Gastrointestinal: Negative for abdominal pain, diarrhea, nausea and vomiting.  Genitourinary: Negative for dysuria and hematuria.  Neurological: Negative for dizziness, sensory change and focal weakness.  All other systems reviewed and are negative.   Nutrition: Heart Healthy/Carb modified Tolerating Diet: Yes Tolerating PT: Ambulatory  DRUG ALLERGIES:   Allergies  Allergen Reactions  . Benadryl [Diphenhydramine] Rash    VITALS:  Blood pressure 106/63, pulse (!) 58, temperature 97.8 F (36.6 C), temperature source Oral, resp. rate 18, height 5' 1.32" (1.558 m), weight 81.4 kg (179 lb 8 oz), SpO2 98 %.  PHYSICAL EXAMINATION:   Physical Exam  GENERAL:  83 y.o.-year-old patient lying in bed in no acute distress.  EYES: Pupils equal, round, reactive to light and accommodation. No scleral icterus. Extraocular muscles intact.  HEENT: Head atraumatic, normocephalic. Oropharynx and nasopharynx clear.  NECK:  Supple, no jugular venous distention. No thyroid enlargement, no tenderness.  LUNGS: Normal breath sounds bilaterally, no wheezing, rales, rhonchi. No use of accessory muscles of respiration.  CARDIOVASCULAR: S1, S2 normal. No murmurs, rubs, or gallops.  ABDOMEN: Soft,  nontender, nondistended. Bowel sounds present. No organomegaly or mass.  EXTREMITIES: No cyanosis, clubbing or edema b/l.    NEUROLOGIC: Cranial nerves II through XII are intact. No focal Motor or sensory deficits b/l.   PSYCHIATRIC: The patient is alert and oriented x 3.  SKIN: No obvious rash, lesion, or ulcer.    LABORATORY PANEL:   CBC  Recent Labs Lab 03/10/17 0801  WBC 8.4  HGB 14.7  HCT 44.1  PLT 175   ------------------------------------------------------------------------------------------------------------------  Chemistries   Recent Labs Lab 03/09/17 1414  NA 137  K 4.2  CL 103  CO2 26  GLUCOSE 120*  BUN 19  CREATININE 1.26*  CALCIUM 9.3   ------------------------------------------------------------------------------------------------------------------  Cardiac Enzymes  Recent Labs Lab 03/10/17 0158  TROPONINI 3.91*   ------------------------------------------------------------------------------------------------------------------  RADIOLOGY:  Dg Chest 2 View  Result Date: 03/09/2017 CLINICAL DATA:  Left chest pain radiating to the back on left shoulder beginning today. EXAM: CHEST  2 VIEW COMPARISON:  PA and lateral chest 10/08/2015. FINDINGS: The lungs are clear. Heart size is normal. No pneumothorax or pleural effusion. Aortic atherosclerosis noted. No acute bony abnormality. IMPRESSION: No acute disease. Atherosclerosis. Electronically Signed   By: Inge Rise M.D.   On: 03/09/2017 14:56   Ct Angio Chest Aorta W And/or Wo Contrast  Result Date: 03/09/2017 CLINICAL DATA:  Chest and back pain. EXAM: CT ANGIOGRAPHY CHEST WITH CONTRAST TECHNIQUE: Multidetector CT imaging of the chest was performed using the standard protocol during bolus administration of intravenous contrast. Multiplanar CT image reconstructions and MIPs were obtained to evaluate the vascular anatomy. CONTRAST:  75 mL of Isovue 370 intravenously. COMPARISON:  CT scan of November 08, 2011. FINDINGS: Cardiovascular: Atherosclerosis  of thoracic aorta is noted without aneurysm or dissection. Great vessels are widely patent without significant stenosis. Coronary artery calcifications are noted. No pericardial effusion is noted. Mediastinum/Nodes: No enlarged mediastinal, hilar, or axillary lymph nodes. Thyroid gland, trachea, and esophagus demonstrate no significant findings. Lungs/Pleura: No pneumothorax or pleural effusion is noted. Stable calcified granuloma is noted in left upper lobe laterally. Mild biapical scarring is noted in stable. 7 mm subpleural nodule is seen in the right lower lobe best seen on image number 45 of series 7. Upper Abdomen: No acute abnormality. Musculoskeletal: No chest wall abnormality. No acute or significant osseous findings. Review of the MIP images confirms the above findings. IMPRESSION: Aortic atherosclerosis. No evidence of thoracic aortic dissection or aneurysm. Coronary artery calcifications are noted suggesting coronary artery disease. 7 mm subpleural nodule seen in right lower lobe. Non-contrast chest CT at 6-12 months is recommended. If the nodule is stable at time of repeat CT, then future CT at 18-24 months (from today's scan) is considered optional for low-risk patients, but is recommended for high-risk patients. This recommendation follows the consensus statement: Guidelines for Management of Incidental Pulmonary Nodules Detected on CT Images: From the Fleischner Society 2017; Radiology 2017; 284:228-243. Electronically Signed   By: Marijo Conception, M.D.   On: 03/09/2017 15:56     ASSESSMENT AND PLAN:   83 year old male with past medical history hypertension, dementia, history of coronary artery disease status post stent placement, diabetes who presented to the hospital due to status and noted to have an elevated troponin.  1. Non-ST elevation MI-this is the cause of patient's chest pain and elevated troponin. -Currently chest pain-free,  hemodynamically stable. Continue aspirin, Plavix, heparin drip. -Continue metoprolol, lisinopril, Pravachol. Seen by cardiology, plan for cardiac catheterization on Monday. -Echocardiogram done and will follow-up results.  2. Diabetes type II without complication-continue sliding scale insulin.  3. Dementia - cont. Namenda.   4. Hyperlipidemia - cont. Pravachol  5. HTN - cont. Metoprolol, Lisinopril.     All the records are reviewed and case discussed with Care Management/Social Worker. Management plans discussed with the patient, family and they are in agreement.  CODE STATUS: Full Code  DVT Prophylaxis: Hep. gtt  TOTAL TIME TAKING CARE OF THIS PATIENT: 30 minutes.   POSSIBLE D/C IN 1-2 DAYS, DEPENDING ON CLINICAL CONDITION.   Henreitta Leber M.D on 03/10/2017 at 1:52 PM  Between 7am to 6pm - Pager - [phone]  After 6pm go to [email or web address] - Proofreader  [facility]  Office  [phone]  CC: Primary care physician; Pcp Not In System

## 2017-03-10 NOTE — Progress Notes (Signed)
ANTICOAGULATION CONSULT NOTE - Follow Up Consult  Pharmacy Consult for heparin drip Indication: chest pain/ACS  Allergies  Allergen Reactions  . Benadryl [Diphenhydramine] Rash    Patient Measurements: Height: 5' 1.32" (155.8 cm) (stated weight) Weight: 179 lb 8 oz (81.4 kg) IBW/kg (Calculated) : 53.04 Heparin Dosing Weight: 70kg  Vital Signs: Temp: 98 F (36.7 C) (04/21 0806) Temp Source: Oral (04/21 0806) BP: 139/64 (04/21 0806) Pulse Rate: 52 (04/21 0806)  Labs:  Recent Labs  03/09/17 1414 03/09/17 2012 03/09/17 2209 03/10/17 0158 03/10/17 0801  HGB 14.4  --   --   --  14.7  HCT 43.3  --   --   --  44.1  PLT 185  --   --   --  175  APTT  --   --  29  --   --   LABPROT  --   --  14.2  --   --   INR  --   --  1.10  --   --   HEPARINUNFRC  --   --   --   --  <0.10*  CREATININE 1.26*  --   --   --   --   TROPONINI <0.03 1.57* 2.72* 3.91*  --     Estimated Creatinine Clearance: 39.8 mL/min (A) (by C-G formula based on SCr of 1.26 mg/dL (H)).   Medical History: Past Medical History:  Diagnosis Date  . Blockage of coronary artery of heart ([place])   . Cancer [facility])    prostate  . Dementia   . Diabetes mellitus without complication (HCC)     Medications:  No anticoagulation in PTA meds.  Assessment:  Goal of Therapy:  Heparin level 0.3-0.7 units/ml Monitor platelets by anticoagulation protocol: Yes   Plan:  4000 unit bolus and initial rate of 850 units/hr. First heparin level 8 hours after start of infusion.  4/21 08:00 HL <0.1. Ordered a bolus of 2100 units and increased drip rate to 1100 units/hr.  Will recheck HL in 8 hours on 4/21 at 18:00.   Olivia Canter, [facility] Clinical Pharmacist 03/10/2017,10:01 AM

## 2017-03-10 NOTE — Progress Notes (Signed)
*  PRELIMINARY RESULTS* Echocardiogram 2D Echocardiogram has been performed.  Don Rhodes 03/10/2017, 12:16 PM

## 2017-03-10 NOTE — Progress Notes (Signed)
ANTICOAGULATION CONSULT NOTE - Initial Consult  Pharmacy Consult for heparin drip Indication: chest pain/ACS  Allergies  Allergen Reactions  . Benadryl [Diphenhydramine] Rash    Patient Measurements: Height: 5' 1.32" (155.8 cm) (stated weight) Weight: 179 lb 8 oz (81.4 kg) IBW/kg (Calculated) : 53.04 Heparin Dosing Weight: 70kg  Vital Signs: Temp: 97.6 F (36.4 C) (04/20 1958) Temp Source: Oral (04/20 1958) BP: 110/54 (04/20 1958) Pulse Rate: 56 (04/20 1958)  Labs:  Recent Labs  03/09/17 1414 03/09/17 2012 03/09/17 2209  HGB 14.4  --   --   HCT 43.3  --   --   PLT 185  --   --   APTT  --   --  29  LABPROT  --   --  14.2  INR  --   --  1.10  CREATININE 1.26*  --   --   TROPONINI <0.03 1.57* 2.72*    Estimated Creatinine Clearance: 39.8 mL/min (A) (by C-G formula based on SCr of 1.26 mg/dL (H)).   Medical History: Past Medical History:  Diagnosis Date  . Blockage of coronary artery of heart ([place])   . Cancer [facility])    prostate  . Dementia   . Diabetes mellitus without complication (HCC)     Medications:  No anticoagulation in PTA meds.  Assessment:  Goal of Therapy:  Heparin level 0.3-0.7 units/ml Monitor platelets by anticoagulation protocol: Yes   Plan:  4000 unit bolus and initial rate of 850 units/hr. First heparin level 8 hours after start of infusion.  Rhiley Tarver S 03/10/2017,12:26 AM

## 2017-03-11 LAB — GLUCOSE, CAPILLARY
GLUCOSE-CAPILLARY: 123 mg/dL — AB (ref 65–99)
GLUCOSE-CAPILLARY: 126 mg/dL — AB (ref 65–99)
GLUCOSE-CAPILLARY: 125 mg/dL — AB (ref 65–99)
Glucose-Capillary: 132 mg/dL — ABNORMAL HIGH (ref 65–99)

## 2017-03-11 LAB — ECHOCARDIOGRAM COMPLETE
Height: 61.32 in
Weight: 2872 oz

## 2017-03-11 LAB — CBC
HCT: 43.2 % (ref 40.0–52.0)
Hemoglobin: 14.4 g/dL (ref 13.0–18.0)
MCH: 30.4 pg (ref 26.0–34.0)
MCHC: 33.4 g/dL (ref 32.0–36.0)
MCV: 91.1 fL (ref 80.0–100.0)
PLATELETS: 177 10*3/uL (ref 150–440)
RBC: 4.74 MIL/uL (ref 4.40–5.90)
RDW: 13.7 % (ref 11.5–14.5)
WBC: 7.8 10*3/uL (ref 3.8–10.6)

## 2017-03-11 LAB — HEPARIN LEVEL (UNFRACTIONATED): Heparin Unfractionated: 0.34 IU/mL (ref 0.30–0.70)

## 2017-03-11 NOTE — Progress Notes (Signed)
       [facility]  SUBJECTIVE: No chest pain   Vitals:   03/10/17 2031 03/10/17 2033 03/11/17 0311 03/11/17 0843  BP: (!) 80/37 (!) 98/53 (!) 108/54 (!) 117/57  Pulse: 65 (!) 53 (!) 53 73  Resp: 18  18 18   Temp: 97.6 F (36.4 C)  97.6 F (36.4 C) 98.2 F (36.8 C)  TempSrc: Oral  Oral Oral  SpO2: 94% 94% (!) 1% 95%  Weight:      Height:        Intake/Output Summary (Last 24 hours) at 03/11/17 1100 Last data filed at 03/11/17 0947  Gross per 24 hour  Intake           947.32 ml  Output             1950 ml  Net         -1002.68 ml    LABS: Basic Metabolic Panel:  Recent Labs  03/09/17 1414  NA 137  K 4.2  CL 103  CO2 26  GLUCOSE 120*  BUN 19  CREATININE 1.26*  CALCIUM 9.3   Liver Function Tests: No results for input(s): AST, ALT, ALKPHOS, BILITOT, PROT, ALBUMIN in the last 72 hours. No results for input(s): LIPASE, AMYLASE in the last 72 hours. CBC:  Recent Labs  03/10/17 0801 03/11/17 0259  WBC 8.4 7.8  HGB 14.7 14.4  HCT 44.1 43.2  MCV 92.5 91.1  PLT 175 177   Cardiac Enzymes:  Recent Labs  03/09/17 2012 03/09/17 2209 03/10/17 0158  TROPONINI 1.57* 2.72* 3.91*   BNP: Invalid input(s): POCBNP D-Dimer: No results for input(s): DDIMER in the last 72 hours. Hemoglobin A1C: No results for input(s): HGBA1C in the last 72 hours. Fasting Lipid Panel:  Recent Labs  03/10/17 0158  CHOL 133  HDL 34*  LDLCALC 88  TRIG 53  CHOLHDL 3.9   Thyroid Function Tests:  Recent Labs  03/09/17 2012  TSH 2.050   Anemia Panel: No results for input(s): VITAMINB12, FOLATE, FERRITIN, TIBC, IRON, RETICCTPCT in the last 72 hours.   Physical Exam: Blood pressure (!) 117/57, pulse 73, temperature 98.2 F (36.8 C), temperature source Oral, resp. rate 18, height 5' 1.32" (1.558 m), weight 81.4 kg (179 lb 8 oz), SpO2 95 %.   Wt Readings from Last 1 Encounters:  03/09/17 81.4 kg (179 lb 8 oz)     General  appearance: alert, cooperative and slowed mentation Resp: clear to auscultation bilaterally Cardio: regular rate and rhythm Pulses: 2+ and symmetric Neurologic: Grossly normal  TELEMETRY: Reviewed telemetry pt in Normal sinus rhythm:  ASSESSMENT AND PLAN:  Principal Problem:   NSTEMI (non-ST elevated myocardial infarction) (HCC)-patient ruled in for non-ST elevation myocardial infarction. Currently hemodynamically stable. On beta blocker, ACE inhibitor, high intensity statin, dual antiplatelet therapy and heparin. Risk and benefits left cardiac catheterization explained to the patient. We'll proceed left cardiac catheter in the a.m. to evaluate for urinary anatomy Further therapy. Further intervention will be based on the results of this. I have discussed with the patient and to family members. Active Problems:   Chest pain, rule out acute myocardial infarction    Teodoro Spray, MD, [facility] 03/11/2017 11:00 AM

## 2017-03-11 NOTE — Progress Notes (Signed)
[place] at [place] NAME: Don Rhodes    MR#:  [id number]  DATE OF BIRTH:  November 07, 1930  SUBJECTIVE:   No chest pain today or overnight. Had an episode of confusion/sundowning with some Ambien overnight. Daughter at bedside.   REVIEW OF SYSTEMS:    Review of Systems  Constitutional: Negative for chills and fever.  HENT: Negative for congestion and tinnitus.   Eyes: Negative for blurred vision and double vision.  Respiratory: Negative for cough, shortness of breath and wheezing.   Cardiovascular: Negative for chest pain, orthopnea and PND.  Gastrointestinal: Negative for abdominal pain, diarrhea, nausea and vomiting.  Genitourinary: Negative for dysuria and hematuria.  Neurological: Negative for dizziness, sensory change and focal weakness.  All other systems reviewed and are negative.   Nutrition: Heart Healthy/Carb modified Tolerating Diet: Yes Tolerating PT: Ambulatory  DRUG ALLERGIES:   Allergies  Allergen Reactions  . Benadryl [Diphenhydramine] Rash    VITALS:  Blood pressure (!) 117/57, pulse 73, temperature 98.2 F (36.8 C), temperature source Oral, resp. rate 18, height 5' 1.32" (1.558 m), weight 81.4 kg (179 lb 8 oz), SpO2 95 %.  PHYSICAL EXAMINATION:   Physical Exam  GENERAL:  83 y.o.-year-old patient lying in bed in no acute distress.  EYES: Pupils equal, round, reactive to light and accommodation. No scleral icterus. Extraocular muscles intact.  HEENT: Head atraumatic, normocephalic. Oropharynx and nasopharynx clear.  NECK:  Supple, no jugular venous distention. No thyroid enlargement, no tenderness.  LUNGS: Normal breath sounds bilaterally, no wheezing, rales, rhonchi. No use of accessory muscles of respiration.  CARDIOVASCULAR: S1, S2 normal. No murmurs, rubs, or gallops.  ABDOMEN: Soft, nontender, nondistended. Bowel sounds present. No organomegaly or mass.  EXTREMITIES: No cyanosis, clubbing or edema b/l.     NEUROLOGIC: Cranial nerves II through XII are intact. No focal Motor or sensory deficits b/l.   PSYCHIATRIC: The patient is alert and oriented x 3.  SKIN: No obvious rash, lesion, or ulcer.    LABORATORY PANEL:   CBC  Recent Labs Lab 03/11/17 0259  WBC 7.8  HGB 14.4  HCT 43.2  PLT 177   ------------------------------------------------------------------------------------------------------------------  Chemistries   Recent Labs Lab 03/09/17 1414  NA 137  K 4.2  CL 103  CO2 26  GLUCOSE 120*  BUN 19  CREATININE 1.26*  CALCIUM 9.3   ------------------------------------------------------------------------------------------------------------------  Cardiac Enzymes  Recent Labs Lab 03/10/17 0158  TROPONINI 3.91*   ------------------------------------------------------------------------------------------------------------------  RADIOLOGY:  Dg Chest 2 View  Result Date: 03/09/2017 CLINICAL DATA:  Left chest pain radiating to the back on left shoulder beginning today. EXAM: CHEST  2 VIEW COMPARISON:  PA and lateral chest 10/08/2015. FINDINGS: The lungs are clear. Heart size is normal. No pneumothorax or pleural effusion. Aortic atherosclerosis noted. No acute bony abnormality. IMPRESSION: No acute disease. Atherosclerosis. Electronically Signed   By: Inge Rise M.D.   On: 03/09/2017 14:56   Ct Angio Chest Aorta W And/or Wo Contrast  Result Date: 03/09/2017 CLINICAL DATA:  Chest and back pain. EXAM: CT ANGIOGRAPHY CHEST WITH CONTRAST TECHNIQUE: Multidetector CT imaging of the chest was performed using the standard protocol during bolus administration of intravenous contrast. Multiplanar CT image reconstructions and MIPs were obtained to evaluate the vascular anatomy. CONTRAST:  75 mL of Isovue 370 intravenously. COMPARISON:  CT scan of November 08, 2011. FINDINGS: Cardiovascular: Atherosclerosis of thoracic aorta is noted without aneurysm or dissection. Great vessels  are widely patent without significant stenosis. Coronary artery  calcifications are noted. No pericardial effusion is noted. Mediastinum/Nodes: No enlarged mediastinal, hilar, or axillary lymph nodes. Thyroid gland, trachea, and esophagus demonstrate no significant findings. Lungs/Pleura: No pneumothorax or pleural effusion is noted. Stable calcified granuloma is noted in left upper lobe laterally. Mild biapical scarring is noted in stable. 7 mm subpleural nodule is seen in the right lower lobe best seen on image number 45 of series 7. Upper Abdomen: No acute abnormality. Musculoskeletal: No chest wall abnormality. No acute or significant osseous findings. Review of the MIP images confirms the above findings. IMPRESSION: Aortic atherosclerosis. No evidence of thoracic aortic dissection or aneurysm. Coronary artery calcifications are noted suggesting coronary artery disease. 7 mm subpleural nodule seen in right lower lobe. Non-contrast chest CT at 6-12 months is recommended. If the nodule is stable at time of repeat CT, then future CT at 18-24 months (from today's scan) is considered optional for low-risk patients, but is recommended for high-risk patients. This recommendation follows the consensus statement: Guidelines for Management of Incidental Pulmonary Nodules Detected on CT Images: From the Fleischner Society 2017; Radiology 2017; 284:228-243. Electronically Signed   By: Marijo Conception, M.D.   On: 03/09/2017 15:56     ASSESSMENT AND PLAN:   83 year old male with past medical history hypertension, dementia, history of coronary artery disease status post stent placement, diabetes who presented to the hospital due to status and noted to have an elevated troponin.  1. Non-ST elevation MI-this is the cause of patient's chest pain and elevated troponin. -Currently chest pain-free, hemodynamically stable. Continue aspirin, Plavix, heparin drip. -Continue metoprolol, lisinopril, Pravachol. Seen by  cardiology, plan for cardiac catheterization on Monday. -Echocardiogram Showing normal ejection fraction and hypokinesis of the anteroseptal myocardium.  2. Diabetes type II without complication-continue sliding scale insulin. - BS stable.   3. Dementia - cont. Namenda.   4. Hyperlipidemia - cont. Pravachol  5. HTN - cont. Metoprolol, Lisinopril.     All the records are reviewed and case discussed with Care Management/Social Worker. Management plans discussed with the patient, family and they are in agreement.  CODE STATUS: Full Code  DVT Prophylaxis: Hep. gtt  TOTAL TIME TAKING CARE OF THIS PATIENT: 25 minutes.   POSSIBLE D/C IN 1-2 DAYS, DEPENDING ON CLINICAL CONDITION.   Henreitta Leber M.D on 03/11/2017 at 1:08 PM  Between 7am to 6pm - Pager - [phone]  After 6pm go to [email or web address] - Proofreader  [facility]  Office  [phone]  CC: Primary care physician; Pcp Not In System

## 2017-03-11 NOTE — Progress Notes (Signed)
Patient remains  Stable, no chest pain or distress at this time, patient scheduled for cardiac cath  tomorrow, no episode of confusion today, family at bedside .

## 2017-03-12 ENCOUNTER — Encounter: Payer: Self-pay | Admitting: Cardiology

## 2017-03-12 ENCOUNTER — Encounter
Admission: EM | Disposition: A | Discharge status: Home / Self Care | Payer: Self-pay | Source: Home / Self Care | Attending: Specialist

## 2017-03-12 HISTORY — PX: LEFT HEART CATH AND CORONARY ANGIOGRAPHY: CATH118249

## 2017-03-12 LAB — GLUCOSE, CAPILLARY
GLUCOSE-CAPILLARY: 118 mg/dL — AB (ref 65–99)
Glucose-Capillary: 207 mg/dL — ABNORMAL HIGH (ref 65–99)

## 2017-03-12 LAB — BASIC METABOLIC PANEL
Anion gap: 7 (ref 5–15)
BUN: 18 mg/dL (ref 6–20)
CALCIUM: 9.3 mg/dL (ref 8.9–10.3)
CHLORIDE: 105 mmol/L (ref 101–111)
CO2: 25 mmol/L (ref 22–32)
CREATININE: 1.06 mg/dL (ref 0.61–1.24)
Glucose, Bld: 121 mg/dL — ABNORMAL HIGH (ref 65–99)
Potassium: 4.4 mmol/L (ref 3.5–5.1)
Sodium: 137 mmol/L (ref 135–145)

## 2017-03-12 LAB — CBC
HCT: 44.3 % (ref 40.0–52.0)
HEMOGLOBIN: 15.1 g/dL (ref 13.0–18.0)
MCH: 31.2 pg (ref 26.0–34.0)
MCHC: 34 g/dL (ref 32.0–36.0)
MCV: 91.7 fL (ref 80.0–100.0)
Platelets: 183 10*3/uL (ref 150–440)
RBC: 4.83 MIL/uL (ref 4.40–5.90)
RDW: 13.8 % (ref 11.5–14.5)
WBC: 8.6 10*3/uL (ref 3.8–10.6)

## 2017-03-12 LAB — HEPARIN LEVEL (UNFRACTIONATED): HEPARIN UNFRACTIONATED: 0.24 [IU]/mL — AB (ref 0.30–0.70)

## 2017-03-12 SURGERY — LEFT HEART CATH AND CORONARY ANGIOGRAPHY
Anesthesia: Moderate Sedation

## 2017-03-12 MED ORDER — MIDAZOLAM HCL 2 MG/2ML IJ SOLN
INTRAMUSCULAR | Status: DC | PRN
  Administered 2017-03-12: 1 mg via INTRAVENOUS

## 2017-03-12 MED ORDER — ISOSORBIDE MONONITRATE ER 30 MG PO TB24: 30.0000 mg | ORAL_TABLET | Freq: Every day | ORAL | 1 refills | Status: AC

## 2017-03-12 MED ORDER — CLOPIDOGREL BISULFATE 75 MG PO TABS: 75.0000 mg | ORAL_TABLET | Freq: Every day | ORAL | 1 refills | Status: AC

## 2017-03-12 MED ORDER — SODIUM CHLORIDE 0.9 % IV SOLN: 250.0000 mL | INTRAVENOUS | Status: DC | PRN

## 2017-03-12 MED ORDER — LIDOCAINE HCL (CARDIAC) 20 MG/ML IV SOLN
INTRAVENOUS | Status: AC
  Filled 2017-03-12: qty 5

## 2017-03-12 MED ORDER — ISOSORBIDE MONONITRATE ER 30 MG PO TB24
30.0000 mg | ORAL_TABLET | Freq: Every day | ORAL | Status: DC
  Administered 2017-03-12: 30 mg via ORAL
  Filled 2017-03-12: qty 1

## 2017-03-12 MED ORDER — LIDOCAINE HCL (PF) 1 % IJ SOLN
INTRAMUSCULAR | Status: DC | PRN
  Administered 2017-03-12: 5 mL via SUBCUTANEOUS

## 2017-03-12 MED ORDER — HEPARIN BOLUS VIA INFUSION
1100.0000 [IU] | Freq: Once | INTRAVENOUS | Status: AC
  Administered 2017-03-12: 1100 [IU] via INTRAVENOUS
  Filled 2017-03-12: qty 1100

## 2017-03-12 MED ORDER — ASPIRIN 81 MG PO CHEW: 81.0000 mg | CHEWABLE_TABLET | ORAL | Status: DC

## 2017-03-12 MED ORDER — SODIUM CHLORIDE 0.9% FLUSH: 3.0000 mL | INTRAVENOUS | Status: DC | PRN

## 2017-03-12 MED ORDER — FENTANYL CITRATE (PF) 100 MCG/2ML IJ SOLN
INTRAMUSCULAR | Status: AC
  Filled 2017-03-12: qty 2

## 2017-03-12 MED ORDER — SODIUM CHLORIDE 0.9 % WEIGHT BASED INFUSION: 1.0000 mL/kg/h | INTRAVENOUS | Status: DC

## 2017-03-12 MED ORDER — SODIUM CHLORIDE 0.9% FLUSH: 3.0000 mL | Freq: Two times a day (BID) | INTRAVENOUS | Status: DC

## 2017-03-12 MED ORDER — FENTANYL CITRATE (PF) 100 MCG/2ML IJ SOLN
INTRAMUSCULAR | Status: DC | PRN
  Administered 2017-03-12: 25 ug via INTRAVENOUS

## 2017-03-12 MED ORDER — SODIUM CHLORIDE 0.9 % WEIGHT BASED INFUSION
3.0000 mL/kg/h | INTRAVENOUS | Status: DC
  Administered 2017-03-12: 3 mL/kg/h via INTRAVENOUS

## 2017-03-12 MED ORDER — SODIUM CHLORIDE 0.9 % IV SOLN
250.0000 mL | INTRAVENOUS | Status: DC | PRN
Start: 2017-03-12 — End: 2017-03-12

## 2017-03-12 MED ORDER — HEPARIN (PORCINE) IN NACL 2-0.9 UNIT/ML-% IJ SOLN
INTRAMUSCULAR | Status: AC
  Filled 2017-03-12: qty 500

## 2017-03-12 MED ORDER — ACETAMINOPHEN 325 MG PO TABS: 650.0000 mg | ORAL_TABLET | ORAL | Status: DC | PRN

## 2017-03-12 MED ORDER — ONDANSETRON HCL 4 MG/2ML IJ SOLN: 4.0000 mg | Freq: Four times a day (QID) | INTRAMUSCULAR | Status: DC | PRN

## 2017-03-12 MED ORDER — SODIUM CHLORIDE 0.9% FLUSH
3.0000 mL | Freq: Two times a day (BID) | INTRAVENOUS | Status: DC
  Administered 2017-03-12: 3 mL via INTRAVENOUS

## 2017-03-12 MED ORDER — IOPAMIDOL (ISOVUE-300) INJECTION 61%
INTRAVENOUS | Status: DC | PRN
  Administered 2017-03-12: 96 mL via INTRA_ARTERIAL

## 2017-03-12 MED ORDER — MIDAZOLAM HCL 2 MG/2ML IJ SOLN
INTRAMUSCULAR | Status: AC
  Filled 2017-03-12: qty 2

## 2017-03-12 SURGICAL SUPPLY — 10 items
CATH 5FR JR4 DIAGNOSTIC (CATHETERS) ×2 IMPLANT
CATH 5FR PIGTAIL DIAGNOSTIC (CATHETERS) ×2 IMPLANT
CATH INFINITI 5FR JL4 (CATHETERS) ×2 IMPLANT
CATH INFINITI 5FR JL5 (CATHETERS) ×2 IMPLANT
DEVICE CLOSURE MYNXGRIP 5F (Vascular Products) ×2 IMPLANT
KIT MANI 3VAL PERCEP (MISCELLANEOUS) ×2 IMPLANT
NEEDLE PERC 18GX7CM (NEEDLE) ×2 IMPLANT
PACK CARDIAC CATH (CUSTOM PROCEDURE TRAY) ×2 IMPLANT
SHEATH AVANTI 5FR X 11CM (SHEATH) ×2 IMPLANT
WIRE EMERALD 3MM-J .035X150CM (WIRE) ×2 IMPLANT

## 2017-03-12 NOTE — Progress Notes (Signed)
ANTICOAGULATION CONSULT NOTE - Follow Up Consult  Pharmacy Consult for heparin drip Indication: chest pain/ACS  Allergies  Allergen Reactions  . Benadryl [Diphenhydramine] Rash    Patient Measurements: Height: 5' 1.32" (155.8 cm) (stated weight) Weight: 179 lb 8 oz (81.4 kg) IBW/kg (Calculated) : 53.04 Heparin Dosing Weight: 70kg  Vital Signs: Temp: 97.5 F (36.4 C) (04/23 0412) Temp Source: Oral (04/23 0412) BP: 135/67 (04/23 0412) Pulse Rate: 49 (04/23 0412)  Labs:  Recent Labs  03/09/17 1414 03/09/17 2012 03/09/17 2209 03/10/17 0158  03/10/17 0801 03/10/17 1815 03/11/17 0259 03/12/17 0418  HGB 14.4  --   --   --   --  14.7  --  14.4 15.1  HCT 43.3  --   --   --   --  44.1  --  43.2 44.3  PLT 185  --   --   --   --  175  --  177 183  APTT  --   --  29  --   --   --   --   --   --   LABPROT  --   --  14.2  --   --   --   --   --   --   INR  --   --  1.10  --   --   --   --   --   --   HEPARINUNFRC  --   --   --   --   < > <0.10* 0.34 0.34 0.24*  CREATININE 1.26*  --   --   --   --   --   --   --   --   TROPONINI <0.03 1.57* 2.72* 3.91*  --   --   --   --   --   < > = values in this interval not displayed.  Estimated Creatinine Clearance: 39.8 mL/min (A) (by C-G formula based on SCr of 1.26 mg/dL (H)).   Medical History: Past Medical History:  Diagnosis Date  . Blockage of coronary artery of heart ([place])   . Cancer [facility])    prostate  . Dementia   . Diabetes mellitus without complication (HCC)     Medications:  No anticoagulation in PTA meds.  Assessment:  Goal of Therapy:  Heparin level 0.3-0.7 units/ml Monitor platelets by anticoagulation protocol: Yes   Plan:  4000 unit bolus and initial rate of 850 units/hr. First heparin level 8 hours after start of infusion.  4/21 08:00 HL <0.1. Ordered a bolus of 2100 units and increased drip rate to 1100 units/hr.  Will recheck HL in 8 hours on 4/21 at 18:00.   4/21 1815 HL therapeutic at 0.34. Will  continue current rate of 1100 units/hr and recheck confirmatory level in 8 hours.   04/22 0300 H: 0.34. Continue current regimen. Recheck heparin level and CBC with 4/23 AM labs.  4/23 AM heparin level 0.24. 1100 unit bolus and increase rate to 1250 units/hr. Recheck in 8 hours.   Sim Boast, PharmD, BCPS  03/12/17 5:30 AM

## 2017-03-12 NOTE — Care Management (Signed)
No discharge needs identified by members of the care team 

## 2017-03-12 NOTE — Progress Notes (Signed)
Patient back from cardiac cath.  Ambulated to the bathroom.  Lunch ordered.  No complaints of pain. Groin site intact, no hematoma.

## 2017-03-12 NOTE — Progress Notes (Signed)
       [facility]  SUBJECTIVE: no chest pain   Vitals:   03/12/17 0947 03/12/17 0950 03/12/17 0955 03/12/17 1000  BP: 127/69   135/72  Pulse: (!) 51 63 (!) 52 (!) 51  Resp: 14 18 14 12   Temp:      TempSrc:      SpO2: 93% 96% 94% 97%  Weight:      Height:        Intake/Output Summary (Last 24 hours) at 03/12/17 1248 Last data filed at 03/12/17 7681  Gross per 24 hour  Intake           758.26 ml  Output             2850 ml  Net         -2091.74 ml    LABS: Basic Metabolic Panel:  Recent Labs  03/09/17 1414 03/12/17 0422  NA 137 137  K 4.2 4.4  CL 103 105  CO2 26 25  GLUCOSE 120* 121*  BUN 19 18  CREATININE 1.26* 1.06  CALCIUM 9.3 9.3   Liver Function Tests: No results for input(s): AST, ALT, ALKPHOS, BILITOT, PROT, ALBUMIN in the last 72 hours. No results for input(s): LIPASE, AMYLASE in the last 72 hours. CBC:  Recent Labs  03/11/17 0259 03/12/17 0418  WBC 7.8 8.6  HGB 14.4 15.1  HCT 43.2 44.3  MCV 91.1 91.7  PLT 177 183   Cardiac Enzymes:  Recent Labs  03/09/17 2012 03/09/17 2209 03/10/17 0158  TROPONINI 1.57* 2.72* 3.91*   BNP: Invalid input(s): POCBNP D-Dimer: No results for input(s): DDIMER in the last 72 hours. Hemoglobin A1C: No results for input(s): HGBA1C in the last 72 hours. Fasting Lipid Panel:  Recent Labs  03/10/17 0158  CHOL 133  HDL 34*  LDLCALC 88  TRIG 53  CHOLHDL 3.9   Thyroid Function Tests:  Recent Labs  03/09/17 2012  TSH 2.050   Anemia Panel: No results for input(s): VITAMINB12, FOLATE, FERRITIN, TIBC, IRON, RETICCTPCT in the last 72 hours.   Physical Exam: Blood pressure 135/72, pulse (!) 51, temperature 98.2 F (36.8 C), temperature source Oral, resp. rate 12, height 5\' 1"  (1.549 m), weight 78.5 kg (173 lb), SpO2 97 %.   Wt Readings from Last 1 Encounters:  03/12/17 78.5 kg (173 lb)     General appearance: alert and cooperative Resp: clear to  auscultation bilaterally Cardio: regular rate and rhythm GI: soft, non-tender; bowel sounds normal; no masses,  no organomegaly Pulses: 2+ and symmetric Neurologic: Grossly normal  TELEMETRY: Reviewed telemetry pt in nsr:  ASSESSMENT AND PLAN:  Principal Problem:   NSTEMI (non-ST elevated myocardial infarction) (HCC)-ruled in for nstemi. Cath revealed patent stent with small vessel disease not amenable to pci due to vessel size. Will continue with medical management to include asa, plavix, metoprolol, statin and lisinopril. OK for discharge today. Followo up in our office.  Active Problems:   Chest pain, rule out acute myocardial infarction    Teodoro Spray, MD, [facility] 03/12/2017 12:48 PM

## 2017-03-12 NOTE — Discharge Summary (Signed)
[facility] [place] NAME: Don Rhodes    MR#:  [id number]  DATE OF BIRTH:  1930/10/29  DATE OF ADMISSION:  03/09/2017 ADMITTING PHYSICIAN: Ubaldo Glassing Hugelmeyer, DO  DATE OF DISCHARGE: 03/12/2017  2:27 PM  PRIMARY CARE PHYSICIAN: Pcp Not In System    ADMISSION DIAGNOSIS:  Chest pain, unspecified type [R07.9]  DISCHARGE DIAGNOSIS:  Principal Problem:   NSTEMI (non-ST elevated myocardial infarction) ([place]) Active Problems:   Chest pain, rule out acute myocardial infarction   SECONDARY DIAGNOSIS:   Past Medical History:  Diagnosis Date  . Blockage of coronary artery of heart ([place])   . Cancer [facility])    prostate  . Dementia   . Diabetes mellitus without complication [facility])     HOSPITAL COURSE:   83 year old male with past medical history hypertension, dementia, history of coronary artery disease status post stent placement, diabetes who presented to the hospital due to status and noted to have an elevated troponin.  1. Non-ST elevation MI-this was the cause of patient's chest pain and elevated troponin. -Patient was admitted to the hospital started on aspirin, Plavix and also on heparin drip. A cardiac catheterization was obtained which showed patent stents and some diffuse small vessel disease which was not amenable to any intervention or angioplasty. Cardiology recommended medical management. -Patient is being therefore discharged on aspirin, Plavix, statin, Imdur and follow-up with cardiology as an outpatient. He is currently chest pain-free and hemodynamically stable. -Echocardiogram Showing normal ejection fraction and hypokinesis of the anteroseptal myocardium.  2. Diabetes type II without complication-on the hospital patient was maintained on sliding scale insulin, but will resume his glipizide upon discharge.  3. Dementia - pt. Will cont. Namenda.   4. Hyperlipidemia - pt. Will cont. Pravachol  5. HTN - pt. Will cont.  Lisinopril, Imdur  DISCHARGE CONDITIONS:   Stable.   CONSULTS OBTAINED:  Treatment Team:  Teodoro Spray, MD  DRUG ALLERGIES:   Allergies  Allergen Reactions  . Benadryl [Diphenhydramine] Rash    DISCHARGE MEDICATIONS:   Allergies as of 03/12/2017      Reactions   Benadryl [diphenhydramine] Rash      Medication List    STOP taking these medications   nitroGLYCERIN 0.2 mg/hr patch Commonly known as:  NITRODUR - Dosed in mg/24 hr   sucralfate 1 g tablet Commonly known as:  CARAFATE     TAKE these medications   aspirin 81 MG tablet Take 81 mg by mouth daily.   clopidogrel 75 MG tablet Commonly known as:  PLAVIX Take 1 tablet (75 mg total) by mouth daily.   docusate sodium 100 MG capsule Commonly known as:  COLACE Take 100 mg by mouth daily as needed for mild constipation.   gabapentin 300 MG capsule Commonly known as:  NEURONTIN Take 300 mg by mouth at bedtime as needed.   glipiZIDE 2.5 MG 24 hr tablet Commonly known as:  GLUCOTROL XL Take 2.5 mg by mouth daily with breakfast.   isosorbide mononitrate 30 MG 24 hr tablet Commonly known as:  IMDUR Take 1 tablet (30 mg total) by mouth daily.   lisinopril 5 MG tablet Commonly known as:  PRINIVIL,ZESTRIL Take 5 mg by mouth daily.   memantine 10 MG tablet Commonly known as:  NAMENDA Take 10 mg by mouth 2 (two) times daily.   pravastatin 40 MG tablet Commonly known as:  PRAVACHOL Take 80 mg by mouth daily.   zolpidem 5 MG tablet Commonly known as:  AMBIEN Take 5 mg by mouth at bedtime as needed.         DISCHARGE INSTRUCTIONS:   DIET:  Cardiac diet and Diabetic diet  DISCHARGE CONDITION:  Stable  ACTIVITY:  Activity as tolerated  OXYGEN:  Home Oxygen: No.   Oxygen Delivery: room air  DISCHARGE LOCATION:  home   If you experience worsening of your admission symptoms, develop shortness of breath, life threatening emergency, suicidal or homicidal thoughts you must seek medical  attention immediately by calling 911 or calling your MD immediately  if symptoms less severe.  You Must read complete instructions/literature along with all the possible adverse reactions/side effects for all the Medicines you take and that have been prescribed to you. Take any new Medicines after you have completely understood and accpet all the possible adverse reactions/side effects.   Please note  You were cared for by a hospitalist during your hospital stay. If you have any questions about your discharge medications or the care you received while you were in the hospital after you are discharged, you can call the unit and asked to speak with the hospitalist on call if the hospitalist that took care of you is not available. Once you are discharged, your primary care physician will handle any further medical issues. Please note that NO REFILLS for any discharge medications will be authorized once you are discharged, as it is imperative that you return to your primary care physician (or establish a relationship with a primary care physician if you do not have one) for your aftercare needs so that they can reassess your need for medications and monitor your lab values.     Today   Seen post cardiac-cath. No chest pain and no other complaints overnight.    VITAL SIGNS:  Blood pressure 135/72, pulse (!) 51, temperature 98.2 F (36.8 C), temperature source Oral, resp. rate 12, height 5\' 1"  (1.549 m), weight 78.5 kg (173 lb), SpO2 97 %.  I/O:   Intake/Output Summary (Last 24 hours) at 03/12/17 1652 Last data filed at 03/12/17 7902  Gross per 24 hour  Intake           518.26 ml  Output             2150 ml  Net         -1631.74 ml    PHYSICAL EXAMINATION:  GENERAL:  83 y.o.-year-old patient lying in the bed with no acute distress.  EYES: Pupils equal, round, reactive to light and accommodation. No scleral icterus. Extraocular muscles intact.  HEENT: Head atraumatic, normocephalic.  Oropharynx and nasopharynx clear.  NECK:  Supple, no jugular venous distention. No thyroid enlargement, no tenderness.  LUNGS: Normal breath sounds bilaterally, no wheezing, rales,rhonchi. No use of accessory muscles of respiration.  CARDIOVASCULAR: S1, S2 normal. No murmurs, rubs, or gallops.  ABDOMEN: Soft, non-tender, non-distended. Bowel sounds present. No organomegaly or mass.  EXTREMITIES: No pedal edema, cyanosis, or clubbing.  NEUROLOGIC: Cranial nerves II through XII are intact. No focal motor or sensory defecits b/l.  PSYCHIATRIC: The patient is alert and oriented x 3. Good affect.  SKIN: No obvious rash, lesion, or ulcer.   DATA REVIEW:   CBC  Recent Labs Lab 03/12/17 0418  WBC 8.6  HGB 15.1  HCT 44.3  PLT 183    Chemistries   Recent Labs Lab 03/12/17 0422  NA 137  K 4.4  CL 105  CO2 25  GLUCOSE 121*  BUN 18  CREATININE 1.06  CALCIUM  9.3    Cardiac Enzymes  Recent Labs Lab 03/10/17 0158  TROPONINI 3.91*    Microbiology Results  No results found for this or any previous visit.  RADIOLOGY:  No results found.    Management plans discussed with the patient, family and they are in agreement.  CODE STATUS:     Code Status Orders        Start     Ordered   03/09/17 1955  Full code  Continuous     03/09/17 1954    Code Status History    Date Active Date Inactive Code Status Order ID Comments User Context   This patient has a current code status but no historical code status.    Advance Directive Documentation     Most Recent Value  Type of Advance Directive  Healthcare Power of Attorney  Pre-existing out of facility DNR order (yellow form or pink MOST form)  -  "MOST" Form in Place?  -      TOTAL TIME TAKING CARE OF THIS PATIENT: 40 minutes.    Henreitta Leber M.D on 03/12/2017 at 4:52 PM  Between 7am to 6pm - Pager - [phone]  After 6pm go to [email or web address] - Proofreader  [employer]  Hospitalists   Office  [phone]  CC: Primary care physician; Pcp Not In System

## 2017-03-12 NOTE — Progress Notes (Signed)
Given vascular discharge instructions.  Patient ambulated around nursing station.  No complaints of pain.  Groin site intact.  Family at bedside.  Discharge information given in great detail.  No questions at this time.  Patient to be escorted out of hospital via wheelchair by volunteers.

## 2018-02-21 ENCOUNTER — Emergency Department: Payer: Medicare Other

## 2018-02-21 ENCOUNTER — Other Ambulatory Visit: Payer: Self-pay

## 2018-02-21 ENCOUNTER — Emergency Department
Admission: EM | Admit: 2018-02-21 | Discharge: 2018-02-21 | Disposition: A | Discharge status: Home / Self Care | Payer: Medicare Other | Attending: Emergency Medicine | Admitting: Emergency Medicine

## 2018-02-21 DIAGNOSIS — Z7982 Long term (current) use of aspirin: Secondary | ICD-10-CM | POA: Insufficient documentation

## 2018-02-21 DIAGNOSIS — Z8546 Personal history of malignant neoplasm of prostate: Secondary | ICD-10-CM | POA: Insufficient documentation

## 2018-02-21 DIAGNOSIS — Z79899 Other long term (current) drug therapy: Secondary | ICD-10-CM | POA: Insufficient documentation

## 2018-02-21 DIAGNOSIS — R1030 Lower abdominal pain, unspecified: Secondary | ICD-10-CM | POA: Insufficient documentation

## 2018-02-21 DIAGNOSIS — E119 Type 2 diabetes mellitus without complications: Secondary | ICD-10-CM | POA: Insufficient documentation

## 2018-02-21 DIAGNOSIS — I252 Old myocardial infarction: Secondary | ICD-10-CM | POA: Diagnosis not present

## 2018-02-21 DIAGNOSIS — F0390 Unspecified dementia without behavioral disturbance: Secondary | ICD-10-CM | POA: Diagnosis not present

## 2018-02-21 LAB — COMPREHENSIVE METABOLIC PANEL
ALBUMIN: 4.8 g/dL (ref 3.5–5.0)
ALK PHOS: 50 U/L (ref 38–126)
ALT: 36 U/L (ref 17–63)
ANION GAP: 8 (ref 5–15)
AST: 32 U/L (ref 15–41)
BILIRUBIN TOTAL: 1 mg/dL (ref 0.3–1.2)
BUN: 16 mg/dL (ref 6–20)
CALCIUM: 9.6 mg/dL (ref 8.9–10.3)
CO2: 28 mmol/L (ref 22–32)
Chloride: 103 mmol/L (ref 101–111)
Creatinine, Ser: 1.21 mg/dL (ref 0.61–1.24)
GFR calc Af Amer: >60 mL/min (ref >60)
GFR calc non Af Amer: 53 mL/min — ABNORMAL LOW (ref >60)
GLUCOSE: 134 mg/dL — AB (ref 65–99)
Potassium: 5 mmol/L (ref 3.5–5.1)
Sodium: 139 mmol/L (ref 135–145)
TOTAL PROTEIN: 7.8 g/dL (ref 6.5–8.1)

## 2018-02-21 LAB — URINALYSIS, COMPLETE (UACMP) WITH MICROSCOPIC
BILIRUBIN URINE: NEGATIVE
Bacteria, UA: NONE SEEN
Glucose, UA: NEGATIVE mg/dL
HGB URINE DIPSTICK: NEGATIVE
Ketones, ur: NEGATIVE mg/dL
Leukocytes, UA: NEGATIVE
NITRITE: NEGATIVE
Protein, ur: NEGATIVE mg/dL
SPECIFIC GRAVITY, URINE: 1.006 (ref 1.005–1.030)
pH: 6 (ref 5.0–8.0)

## 2018-02-21 LAB — CBC
HCT: 46.1 % (ref 40.0–52.0)
HEMOGLOBIN: 15.1 g/dL (ref 13.0–18.0)
MCH: 30.5 pg (ref 26.0–34.0)
MCHC: 32.7 g/dL (ref 32.0–36.0)
MCV: 93.3 fL (ref 80.0–100.0)
PLATELETS: 200 10*3/uL (ref 150–440)
RBC: 4.94 MIL/uL (ref 4.40–5.90)
RDW: 13.7 % (ref 11.5–14.5)
WBC: 10.2 10*3/uL (ref 3.8–10.6)

## 2018-02-21 LAB — LIPASE, BLOOD: Lipase: 43 U/L (ref 11–51)

## 2018-02-21 MED ORDER — IOPAMIDOL (ISOVUE-300) INJECTION 61%
30.0000 mL | Freq: Once | INTRAVENOUS | Status: AC | PRN
  Administered 2018-02-21: 30 mL via ORAL

## 2018-02-21 MED ORDER — TRAMADOL HCL 50 MG PO TABS: 50.0000 mg | ORAL_TABLET | Freq: Four times a day (QID) | ORAL | 0 refills | Status: AC | PRN

## 2018-02-21 MED ORDER — ONDANSETRON HCL 4 MG/2ML IJ SOLN
4.0000 mg | Freq: Once | INTRAMUSCULAR | Status: AC
  Administered 2018-02-21: 4 mg via INTRAVENOUS
  Filled 2018-02-21: qty 2

## 2018-02-21 MED ORDER — MORPHINE SULFATE (PF) 4 MG/ML IV SOLN
4.0000 mg | Freq: Once | INTRAVENOUS | Status: AC
  Administered 2018-02-21: 4 mg via INTRAVENOUS
  Filled 2018-02-21: qty 1

## 2018-02-21 MED ORDER — IOHEXOL 300 MG/ML  SOLN
100.0000 mL | Freq: Once | INTRAMUSCULAR | Status: AC | PRN
  Administered 2018-02-21: 100 mL via INTRAVENOUS

## 2018-02-21 NOTE — ED Notes (Signed)
Patient informed that he cannot be discharged until 17:36 due to his morphine administration.  Patient declines having a ride or wanting to take a taxi home.

## 2018-02-21 NOTE — ED Triage Notes (Addendum)
Pt states he went to doctor today thinking he was having pain in bladder. Doctor stated it wasn't his bladder but his abd. C/o lower abd pain all the way across and across lower back. Pt states he can urine but not much. "I know a got some tumors down there" and states they are in stomach. Had prostate removed. States had bladder scan done at doctor and showed no residual after urinating. Still has appendix, gallbladder. Denies kidney issues or liver issues. Pt states "I think it's swollen some" talking about abd distention. States pain began at 1am when trying to urinate.  Alert, oriented, ambulatory. States he has nitro patch on L chest that he put on this AM.

## 2018-02-21 NOTE — ED Provider Notes (Signed)
[facility] Emergency Department Provider Note   ____________________________________________    I have reviewed the triage vital signs and the nursing notes.   HISTORY  Chief Complaint Abdominal Pain     HPI Don Rhodes is a 84 y.o. male who presents with complaints of abdominal pain.  Patient complains of lower abdominal pain which began yesterday evening.  He reports is been consistent throughout the night.  He had difficulty urinating last night.  Went to see his PCP and apparently had no difficulty urinating there.  Denies fevers or chills.  No nausea or vomiting.  Has never had this pain before.  Does have a history of abdominal surgery.  Has not taken anything for the pain, pain does not radiate   Past Medical History:  Diagnosis Date  . Blockage of coronary artery of heart ([place])   . Cancer [facility])    prostate  . Dementia   . Diabetes mellitus without complication [facility])     Patient Active Problem List   Diagnosis Date Noted  . NSTEMI (non-ST elevated myocardial infarction) ([place]) 03/10/2017  . Chest pain, rule out acute myocardial infarction 03/09/2017    Past Surgical History:  Procedure Laterality Date  . back operation    . CAROTID STENT    . COLECTOMY    . HERNIA REPAIR    . KNEE SURGERY    . LEFT HEART CATH AND CORONARY ANGIOGRAPHY N/A 03/12/2017   Procedure: Left Heart Cath and Coronary Angiography;  Surgeon: Teodoro Spray, MD;  Location: [place] CV LAB;  Service: Cardiovascular;  Laterality: N/A;    Prior to Admission medications   Medication Sig Start Date End Date Taking? Authorizing Provider  acetaminophen (TYLENOL) 650 MG CR tablet Take 650 mg by mouth daily.   Yes [provider]  aspirin 81 MG tablet Take 81 mg by mouth daily.   Yes [provider]  clopidogrel (PLAVIX) 75 MG tablet Take 1 tablet (75 mg total) by mouth daily. 03/12/17  Yes Henreitta Leber, MD  docusate sodium (COLACE)  100 MG capsule Take 100 mg by mouth daily as needed for mild constipation.   Yes [provider]  glipiZIDE (GLUCOTROL XL) 2.5 MG 24 hr tablet Take 2.5 mg by mouth daily with breakfast.   Yes [provider]  memantine (NAMENDA) 10 MG tablet Take 10 mg by mouth 2 (two) times daily.   Yes [provider]  nitroGLYCERIN (NITRODUR - DOSED IN MG/24 HR) 0.2 mg/hr patch Place 1 patch onto the skin every 12 (twelve) hours as needed. 11/22/17  Yes [provider]  pravastatin (PRAVACHOL) 80 MG tablet Take 80 mg by mouth daily.   Yes [provider]  tiZANidine (ZANAFLEX) 2 MG tablet Take 2 mg by mouth at bedtime as needed for muscle spasms.   Yes [provider]  zolpidem (AMBIEN) 5 MG tablet Take 5 mg by mouth at bedtime as needed for sleep.    Yes [provider]  isosorbide mononitrate (IMDUR) 30 MG 24 hr tablet Take 1 tablet (30 mg total) by mouth daily. Patient not taking: Reported on 02/21/2018 03/12/17   Henreitta Leber, MD  traMADol (ULTRAM) 50 MG tablet Take 1 tablet (50 mg total) by mouth every 6 (six) hours as needed. 02/21/18 02/21/19  Lavonia Drafts, MD     Allergies Benadryl [diphenhydramine]  History reviewed. No pertinent family history.  Social History Social History   Tobacco Use  . Smoking  status: Never Smoker  . Smokeless tobacco: Never Used  Substance Use Topics  . Alcohol use: No  . Drug use: No    Review of Systems  Constitutional: No fever/chills Eyes: No visual changes.  ENT: No sore throat. Cardiovascular: Denies chest pain. Respiratory: Denies shortness of breath. Gastrointestinal: Dominant pain as above Genitourinary: Negative for dysuria. Musculoskeletal: Negative for back pain. Skin: Negative for rash. Neurological: Negative for headaches    ____________________________________________   PHYSICAL EXAM:  VITAL SIGNS: ED Triage Vitals  Enc Vitals Group     BP 02/21/18 1227 (!) 132/92      Pulse Rate 02/21/18 1227 89     Resp 02/21/18 1227 18     Temp 02/21/18 1227 98 F (36.7 C)     Temp Source 02/21/18 1227 Oral     SpO2 02/21/18 1227 97 %     Weight 02/21/18 1229 81.6 kg (180 lb)     Height 02/21/18 1229 1.803 m (5\' 11" )     Head Circumference --      Peak Flow --      Pain Score 02/21/18 1227 10     Pain Loc --      Pain Edu? --      Excl. in [place]? --     Constitutional: Alert and oriented. No acute distress. Pleasant and interactive Eyes: Conjunctivae are normal.   Nose: No congestion/rhinnorhea. Mouth/Throat: Mucous membranes are moist.    Cardiovascular: Normal rate, regular rhythm. Grossly normal heart sounds.  Good peripheral circulation. Respiratory: Normal respiratory effort.  No retractions. Lungs CTAB. Gastrointestinal: Mild lower abdomen tenderness bilaterally. No distention.  No CVA tenderness. Genitourinary: deferred Musculoskeletal:   Warm and well perfused Neurologic:  Normal speech and language. No gross focal neurologic deficits are appreciated.  Skin:  Skin is warm, dry and intact. No rash noted. Psychiatric: Mood and affect are normal. Speech and behavior are normal.  ____________________________________________   LABS (all labs ordered are listed, but only abnormal results are displayed)  Labs Reviewed  COMPREHENSIVE METABOLIC PANEL - Abnormal; Notable for the following components:      Result Value   Glucose, Bld 134 (*)    GFR calc non Af Amer 53 (*)    All other components within normal limits  URINALYSIS, COMPLETE (UACMP) WITH MICROSCOPIC - Abnormal; Notable for the following components:   Color, Urine STRAW (*)    APPearance CLEAR (*)    Squamous Epithelial / LPF 0-5 (*)    All other components within normal limits  LIPASE, BLOOD  CBC   ____________________________________________  EKG  None ____________________________________________  RADIOLOGY  CT abdomen pelvis unremarkable, no kidney stones  noted ____________________________________________   PROCEDURES  Procedure(s) performed: No  Procedures   Critical Care performed: no ____________________________________________   INITIAL IMPRESSION / ASSESSMENT AND PLAN / ED COURSE  Pertinent labs & imaging results that were available during my care of the patient were reviewed by me and considered in my medical decision making (see chart for details).  Patient presents with complaints of lower abdominal pain, initially had some urinary retention but apparently that resolved on its own.  Denies constipation.  Lab work is overall reassuring, vitals unremarkable.  Exam mild tenderness in the lower abdomen.  CT abdomen pelvis pending, treated with IV morphine IV Zofran.  ----------------------------------------- 3:16 PM on 02/21/2018 -----------------------------------------  Patient with normal CT abdomen pelvis, normal lab work, reassuring exam, normal urinalysis, normal vitals.  And now feeling back to baseline after treatment, no  pain.  Discussed findings including CT and labs and the need for close follow-up with PCP and the need to return if his symptoms return.  Patient is content with this plan    ____________________________________________   FINAL CLINICAL IMPRESSION(S) / ED DIAGNOSES  Final diagnoses:  Lower abdominal pain        Note:  This document was prepared using Dragon voice recognition software and may include unintentional dictation errors.    Lavonia Drafts, MD 02/21/18 [phone]

## 2020-02-26 IMAGING — CT CT ABD-PELV W/ CM
2 of 5 series · 15 of 46 positions shown, 17 images · IV contrast (APPLIED)
Comparison: MRI 01/26/2016.  CT scan 01/03/2016

CLINICAL DATA: Lower abdominal pain

EXAM:
CT ABDOMEN AND PELVIS WITH CONTRAST
TECHNIQUE: Multidetector CT imaging of the abdomen and pelvis was performed
using the standard protocol following bolus administration of
intravenous contrast.
CONTRAST:  100mL OMNIPAQUE IOHEXOL 300 MG/ML  SOLN

[Series 2: routine abd/pel with · axial · 0.81mm/px · z∈[-584,-129]mm · 12 of 107 slices shown, 14 images]
[im 8/107  soft-tissue]
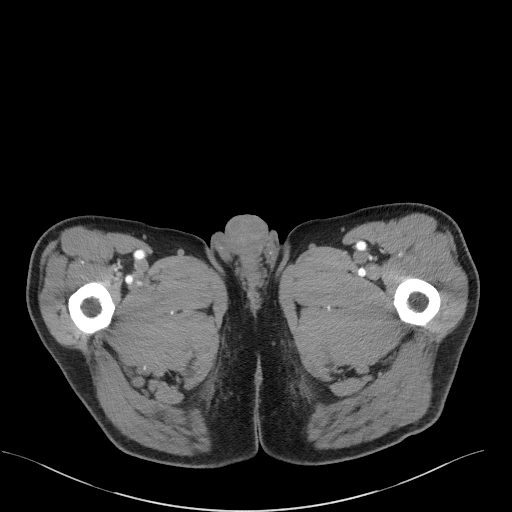
[im 8/107  bone]
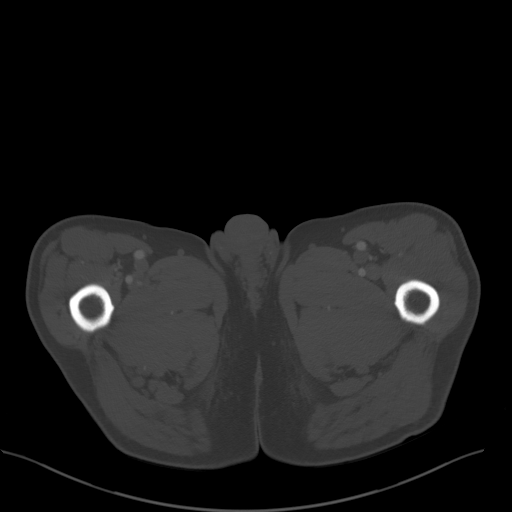
[im 15/107  soft-tissue]
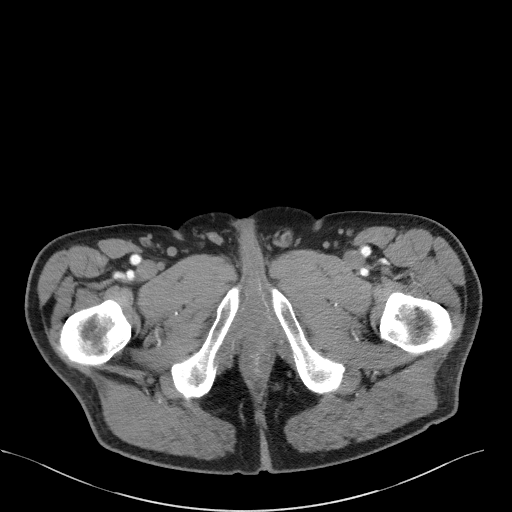
[im 22/107  soft-tissue]
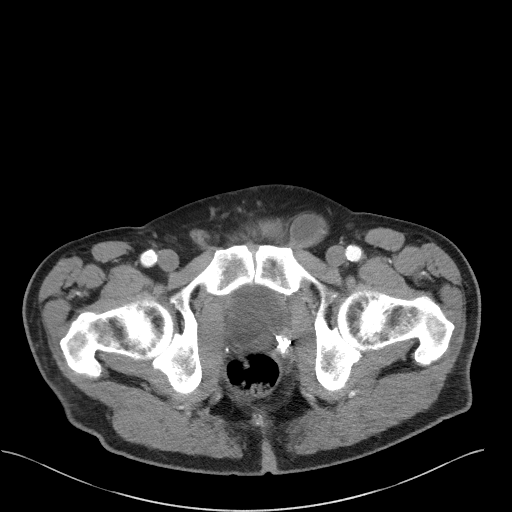
[im 36/107  soft-tissue]
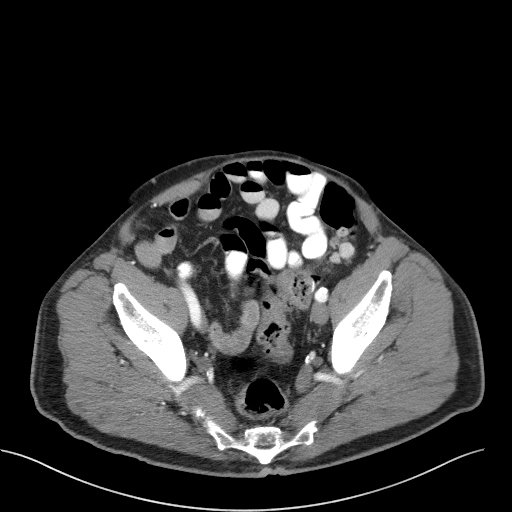
[im 43/107  soft-tissue]
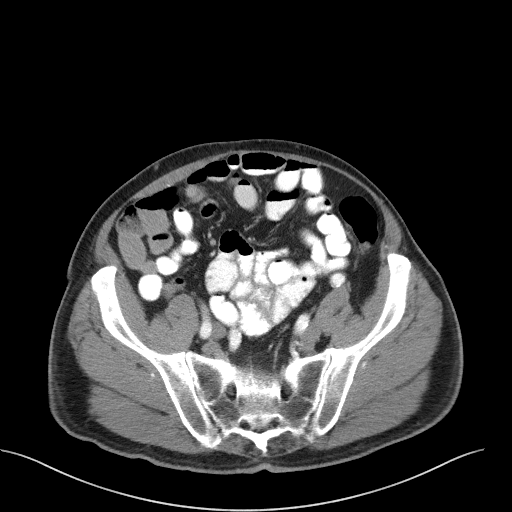
[im 50/107  soft-tissue]
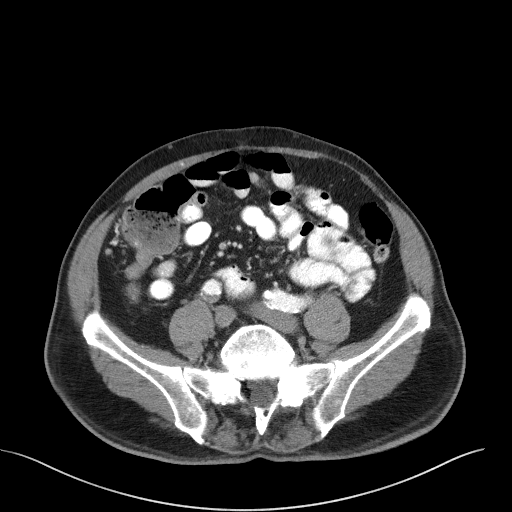
[im 57/107  soft-tissue]
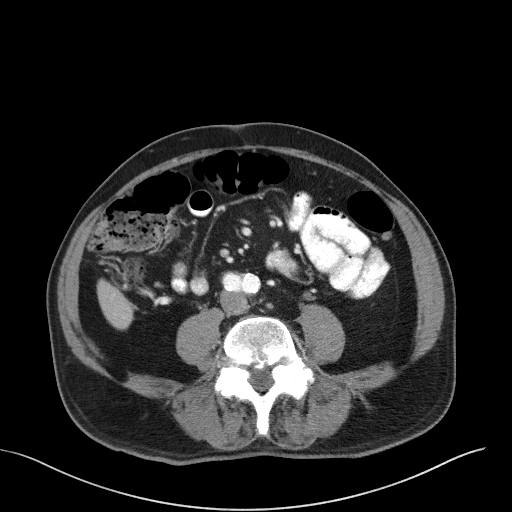
[im 64/107  soft-tissue]
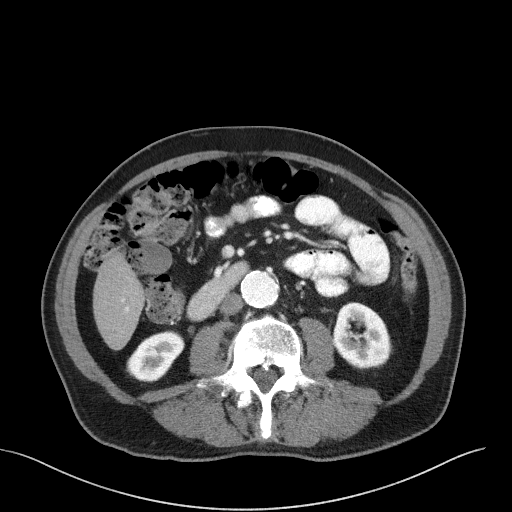
[im 71/107  soft-tissue]
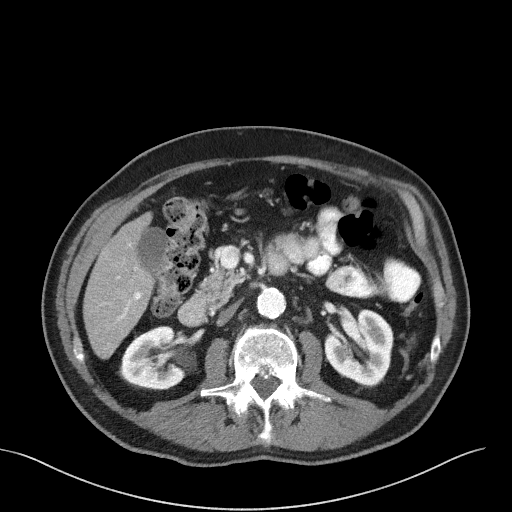
[im 71/107  bone]
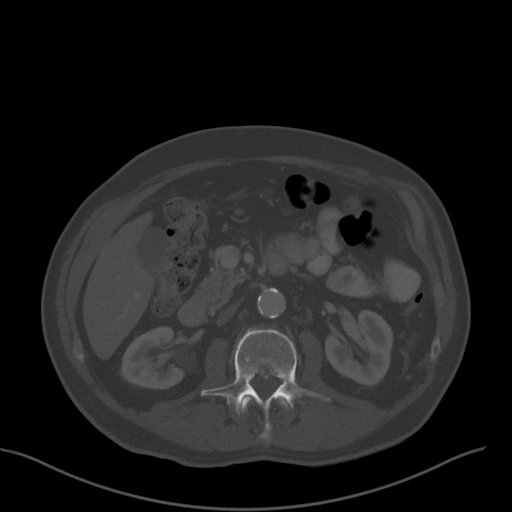
[im 85/107  soft-tissue]
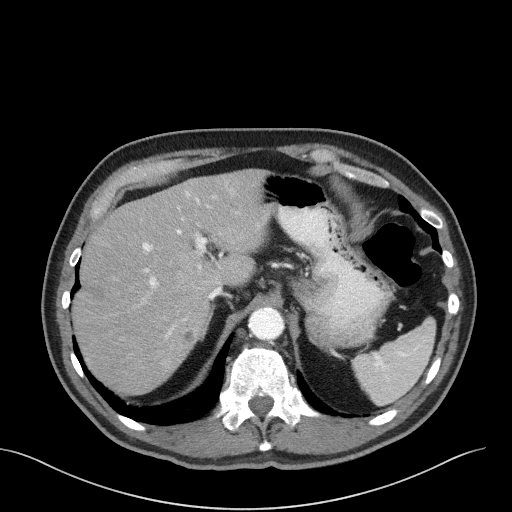
[im 92/107  soft-tissue]
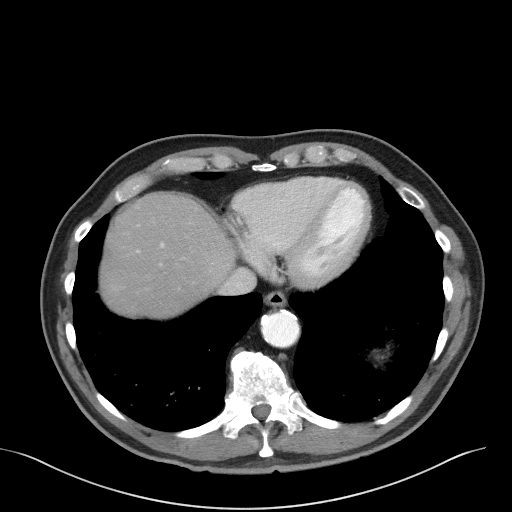
[im 99/107  soft-tissue]
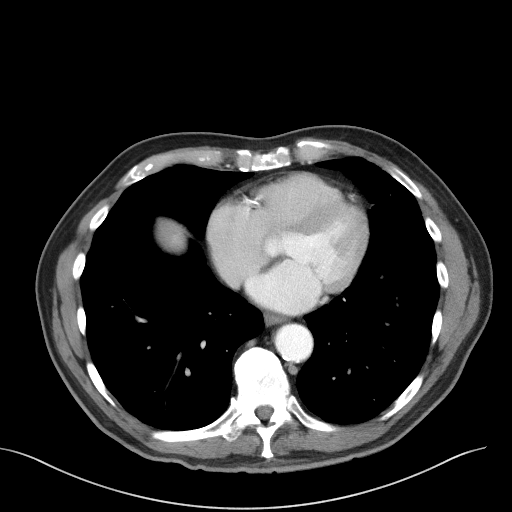

[Series 5: coronal st · coronal · 0.76mm/px · 3 of 97 slices shown]
[im 33/97  soft-tissue]
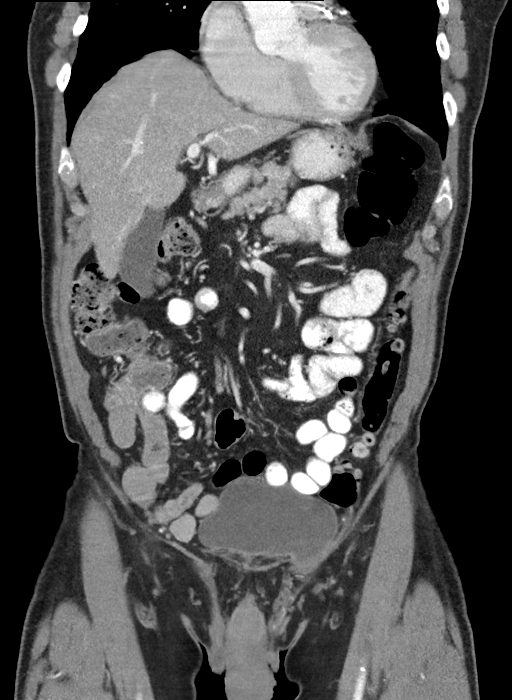
[im 43/97  soft-tissue]
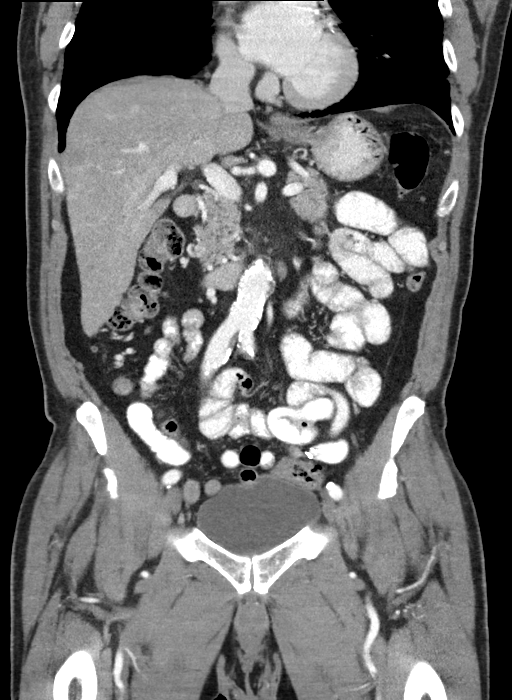
[im 54/97  soft-tissue]
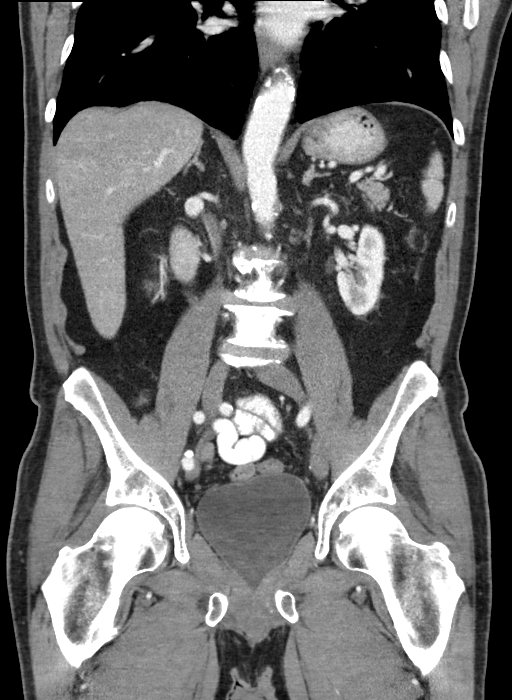

[15 of 46 positions shown; findings below may reference images not displayed]

FINDINGS: Lower chest: Emphysema noted at the lung bases.

Hepatobiliary: Similar appearance of numerous tiny hypervascular
foci distributed through both hepatic lobes. Previous MRI
characterized these as benign perfusion anomalies. Interval
stability is further suggestive of benign etiology. Small
hypoattenuating lesion in the medial right liver is stable. There is
no evidence for gallstones, gallbladder wall thickening, or
pericholecystic fluid. No intrahepatic or extrahepatic biliary
dilation.

Pancreas: No focal mass lesion. No dilatation of the main duct. No
intraparenchymal cyst. No peripancreatic edema.

Spleen: No splenomegaly. No focal mass lesion.

Adrenals/Urinary Tract: No adrenal nodule or mass. 12 mm cystic
lesion in the lower pole the left kidney is stable. Tiny low-density
lesion in the interpolar right kidney is too small to characterize
but stable in the interval. No evidence for hydroureter. Bladder
extends into a left groin hernia but is otherwise unremarkable.

Stomach/Bowel: Stomach is nondistended. No gastric wall thickening.
No evidence of outlet obstruction. Duodenum is normally positioned
as is the ligament of Treitz. No small bowel wall thickening. No
small bowel dilatation. The terminal ileum is normal. The appendix
is normal. Suture line noted in the sigmoid colon.

Vascular/Lymphatic: Atherosclerotic calcification noted in the
abdominal aorta which measures 3.0 cm maximum diameter. There is no
gastrohepatic or hepatoduodenal ligament lymphadenopathy. No
intraperitoneal or retroperitoneal lymphadenopathy. No pelvic
sidewall lymphadenopathy.

Reproductive: Prostate gland is surgically absent.

Other: No intraperitoneal free fluid.

Musculoskeletal: Bone windows reveal no worrisome lytic or sclerotic
osseous lesions. Degenerative changes noted lower lumbar spine.
Lucent lesion in the L2 vertebral body is stable.
IMPRESSION: 1. No acute findings in the abdomen or pelvis. Since the prior
study, a portion of the anterior left bladder has bulged into a left
groin hernia. No associated bladder wall thickening. No edema or
fluid is seen around the herniated bladder.
2. Similar appearance of numerous hypervascular foci distributed
through both hepatic lobes. 2 year interval stability is reassuring
for benign etiology and previous MRI characterized these findings as
benign perfusion anomalies.
3. Stable 3.0 cm fusiform abdominal aortic aneurysm.
4. 6 mm cystic lesion identified in the tail of pancreas on previous
MRI not well demonstrated on CT.

## 2021-02-18 DEATH — deceased
